# Patient Record
Sex: Female | Born: 1935 | Race: White | Hispanic: No
Health system: Southern US, Community
[De-identification: ages and names within clinical notes are randomized; demographics above are authoritative.]

---

## 2002-03-15 ENCOUNTER — Encounter: Payer: Self-pay | Admitting: General Surgery

## 2002-03-15 ENCOUNTER — Ambulatory Visit (HOSPITAL_COMMUNITY): Admission: RE | Admit: 2002-03-15 | Discharge: 2002-03-15 | Payer: Self-pay | Admitting: General Surgery

## 2002-10-19 ENCOUNTER — Encounter: Payer: Self-pay | Admitting: Emergency Medicine

## 2002-10-19 ENCOUNTER — Inpatient Hospital Stay (HOSPITAL_COMMUNITY): Admission: EM | Admit: 2002-10-19 | Discharge: 2002-10-24 | Payer: Self-pay | Admitting: Emergency Medicine

## 2003-03-16 ENCOUNTER — Emergency Department (HOSPITAL_COMMUNITY): Admission: EM | Admit: 2003-03-16 | Discharge: 2003-03-16 | Payer: Self-pay | Admitting: Internal Medicine

## 2003-04-26 ENCOUNTER — Encounter: Payer: Self-pay | Admitting: Orthopedic Surgery

## 2003-04-26 ENCOUNTER — Encounter: Admission: RE | Admit: 2003-04-26 | Discharge: 2003-04-26 | Payer: Self-pay | Admitting: Orthopedic Surgery

## 2003-04-26 ENCOUNTER — Encounter: Payer: Self-pay | Admitting: Radiology

## 2003-05-17 ENCOUNTER — Encounter: Admission: RE | Admit: 2003-05-17 | Discharge: 2003-05-17 | Payer: Self-pay | Admitting: Orthopedic Surgery

## 2003-05-17 ENCOUNTER — Encounter: Payer: Self-pay | Admitting: Orthopedic Surgery

## 2003-05-31 ENCOUNTER — Encounter: Payer: Self-pay | Admitting: Orthopedic Surgery

## 2003-05-31 ENCOUNTER — Encounter: Admission: RE | Admit: 2003-05-31 | Discharge: 2003-05-31 | Payer: Self-pay | Admitting: Orthopedic Surgery

## 2003-08-07 ENCOUNTER — Ambulatory Visit (HOSPITAL_COMMUNITY): Admission: RE | Admit: 2003-08-07 | Discharge: 2003-08-07 | Payer: Self-pay | Admitting: Family Medicine

## 2003-08-07 ENCOUNTER — Encounter: Payer: Self-pay | Admitting: Family Medicine

## 2003-09-27 ENCOUNTER — Ambulatory Visit (HOSPITAL_COMMUNITY): Admission: RE | Admit: 2003-09-27 | Discharge: 2003-09-27 | Payer: Self-pay | Admitting: Orthopedic Surgery

## 2003-10-12 ENCOUNTER — Encounter: Payer: Self-pay | Admitting: Emergency Medicine

## 2003-10-12 ENCOUNTER — Emergency Department (HOSPITAL_COMMUNITY): Admission: EM | Admit: 2003-10-12 | Discharge: 2003-10-12 | Payer: Self-pay | Admitting: Emergency Medicine

## 2003-11-08 ENCOUNTER — Ambulatory Visit (HOSPITAL_COMMUNITY): Admission: RE | Admit: 2003-11-08 | Discharge: 2003-11-08 | Payer: Self-pay | Admitting: Neurology

## 2004-07-23 ENCOUNTER — Inpatient Hospital Stay (HOSPITAL_COMMUNITY): Admission: EM | Admit: 2004-07-23 | Discharge: 2004-07-24 | Payer: Self-pay | Admitting: Emergency Medicine

## 2004-07-24 ENCOUNTER — Inpatient Hospital Stay (HOSPITAL_COMMUNITY): Admission: EM | Admit: 2004-07-24 | Discharge: 2004-07-27 | Payer: Self-pay | Admitting: Emergency Medicine

## 2004-07-26 ENCOUNTER — Encounter: Payer: Self-pay | Admitting: Neurology

## 2004-09-28 ENCOUNTER — Ambulatory Visit (HOSPITAL_COMMUNITY): Admission: RE | Admit: 2004-09-28 | Discharge: 2004-09-28 | Payer: Self-pay | Admitting: Family Medicine

## 2004-10-07 ENCOUNTER — Ambulatory Visit (HOSPITAL_COMMUNITY): Admission: RE | Admit: 2004-10-07 | Discharge: 2004-10-07 | Payer: Self-pay | Admitting: Family Medicine

## 2004-10-20 ENCOUNTER — Encounter (HOSPITAL_COMMUNITY): Admission: RE | Admit: 2004-10-20 | Discharge: 2004-11-19 | Payer: Self-pay | Admitting: Orthopedic Surgery

## 2004-10-22 ENCOUNTER — Emergency Department (HOSPITAL_COMMUNITY): Admission: EM | Admit: 2004-10-22 | Discharge: 2004-10-22 | Payer: Self-pay | Admitting: Emergency Medicine

## 2004-11-20 ENCOUNTER — Encounter (HOSPITAL_COMMUNITY): Admission: RE | Admit: 2004-11-20 | Discharge: 2004-12-20 | Payer: Self-pay | Admitting: Orthopedic Surgery

## 2004-12-04 ENCOUNTER — Emergency Department (HOSPITAL_COMMUNITY): Admission: EM | Admit: 2004-12-04 | Discharge: 2004-12-04 | Payer: Self-pay | Admitting: Emergency Medicine

## 2004-12-09 ENCOUNTER — Ambulatory Visit: Payer: Self-pay | Admitting: Orthopedic Surgery

## 2005-03-31 ENCOUNTER — Ambulatory Visit: Payer: Self-pay | Admitting: Cardiology

## 2005-03-31 ENCOUNTER — Ambulatory Visit (HOSPITAL_COMMUNITY): Admission: RE | Admit: 2005-03-31 | Discharge: 2005-03-31 | Payer: Self-pay | Admitting: Neurology

## 2005-06-04 ENCOUNTER — Ambulatory Visit (HOSPITAL_COMMUNITY): Admission: RE | Admit: 2005-06-04 | Discharge: 2005-06-04 | Payer: Self-pay | Admitting: Family Medicine

## 2005-06-15 ENCOUNTER — Emergency Department (HOSPITAL_COMMUNITY): Admission: EM | Admit: 2005-06-15 | Discharge: 2005-06-15 | Payer: Self-pay | Admitting: Emergency Medicine

## 2005-08-02 ENCOUNTER — Ambulatory Visit (HOSPITAL_COMMUNITY): Admission: RE | Admit: 2005-08-02 | Discharge: 2005-08-02 | Payer: Self-pay | Admitting: Family Medicine

## 2005-09-09 ENCOUNTER — Ambulatory Visit (HOSPITAL_COMMUNITY): Admission: RE | Admit: 2005-09-09 | Discharge: 2005-09-09 | Payer: Self-pay | Admitting: Neurology

## 2006-10-21 ENCOUNTER — Ambulatory Visit (HOSPITAL_COMMUNITY): Admission: RE | Admit: 2006-10-21 | Discharge: 2006-10-21 | Payer: Self-pay | Admitting: Family Medicine

## 2006-12-05 ENCOUNTER — Ambulatory Visit (HOSPITAL_COMMUNITY): Admission: RE | Admit: 2006-12-05 | Discharge: 2006-12-05 | Payer: Self-pay | Admitting: Urology

## 2007-01-13 ENCOUNTER — Ambulatory Visit (HOSPITAL_COMMUNITY): Admission: RE | Admit: 2007-01-13 | Discharge: 2007-01-13 | Payer: Self-pay | Admitting: Family Medicine

## 2007-01-19 ENCOUNTER — Ambulatory Visit (HOSPITAL_COMMUNITY): Admission: RE | Admit: 2007-01-19 | Discharge: 2007-01-19 | Payer: Self-pay | Admitting: Family Medicine

## 2007-06-14 ENCOUNTER — Ambulatory Visit (HOSPITAL_COMMUNITY): Admission: RE | Admit: 2007-06-14 | Discharge: 2007-06-14 | Payer: Self-pay | Admitting: Family Medicine

## 2007-06-15 ENCOUNTER — Ambulatory Visit (HOSPITAL_COMMUNITY): Admission: RE | Admit: 2007-06-15 | Discharge: 2007-06-15 | Payer: Self-pay | Admitting: Family Medicine

## 2007-07-03 ENCOUNTER — Ambulatory Visit: Payer: Self-pay | Admitting: Orthopedic Surgery

## 2007-07-24 ENCOUNTER — Ambulatory Visit: Payer: Self-pay | Admitting: Orthopedic Surgery

## 2007-07-25 DIAGNOSIS — R569 Unspecified convulsions: Secondary | ICD-10-CM | POA: Insufficient documentation

## 2007-07-25 DIAGNOSIS — Z8679 Personal history of other diseases of the circulatory system: Secondary | ICD-10-CM | POA: Insufficient documentation

## 2007-07-25 DIAGNOSIS — E119 Type 2 diabetes mellitus without complications: Secondary | ICD-10-CM | POA: Insufficient documentation

## 2007-12-18 ENCOUNTER — Ambulatory Visit (HOSPITAL_COMMUNITY): Admission: RE | Admit: 2007-12-18 | Discharge: 2007-12-18 | Payer: Self-pay | Admitting: Family Medicine

## 2007-12-20 ENCOUNTER — Ambulatory Visit (HOSPITAL_COMMUNITY): Admission: RE | Admit: 2007-12-20 | Discharge: 2007-12-20 | Payer: Self-pay | Admitting: Family Medicine

## 2007-12-27 ENCOUNTER — Emergency Department (HOSPITAL_COMMUNITY): Admission: EM | Admit: 2007-12-27 | Discharge: 2007-12-27 | Payer: Self-pay | Admitting: Emergency Medicine

## 2008-01-19 ENCOUNTER — Emergency Department (HOSPITAL_COMMUNITY): Admission: EM | Admit: 2008-01-19 | Discharge: 2008-01-19 | Payer: Self-pay | Admitting: Emergency Medicine

## 2008-01-19 ENCOUNTER — Encounter: Payer: Self-pay | Admitting: Orthopedic Surgery

## 2008-01-22 ENCOUNTER — Ambulatory Visit: Payer: Self-pay | Admitting: Orthopedic Surgery

## 2008-01-22 DIAGNOSIS — S62319A Displaced fracture of base of unspecified metacarpal bone, initial encounter for closed fracture: Secondary | ICD-10-CM

## 2008-03-04 ENCOUNTER — Ambulatory Visit: Payer: Self-pay | Admitting: Orthopedic Surgery

## 2008-09-02 ENCOUNTER — Ambulatory Visit: Payer: Self-pay | Admitting: Cardiology

## 2008-09-02 ENCOUNTER — Inpatient Hospital Stay (HOSPITAL_COMMUNITY): Admission: AD | Admit: 2008-09-02 | Discharge: 2008-09-04 | Payer: Self-pay

## 2008-09-03 ENCOUNTER — Encounter (INDEPENDENT_AMBULATORY_CARE_PROVIDER_SITE_OTHER): Payer: Self-pay | Admitting: Internal Medicine

## 2009-03-13 ENCOUNTER — Ambulatory Visit (HOSPITAL_COMMUNITY): Admission: RE | Admit: 2009-03-13 | Discharge: 2009-03-13 | Payer: Self-pay | Admitting: Family Medicine

## 2009-08-11 ENCOUNTER — Other Ambulatory Visit: Admission: RE | Admit: 2009-08-11 | Discharge: 2009-08-11 | Payer: Self-pay | Admitting: Family Medicine

## 2009-08-11 ENCOUNTER — Encounter (INDEPENDENT_AMBULATORY_CARE_PROVIDER_SITE_OTHER): Payer: Self-pay | Admitting: Family Medicine

## 2010-07-10 ENCOUNTER — Emergency Department (HOSPITAL_COMMUNITY)
Admission: EM | Admit: 2010-07-10 | Discharge: 2010-07-10 | Payer: Self-pay | Source: Home / Self Care | Admitting: Emergency Medicine

## 2010-08-22 ENCOUNTER — Ambulatory Visit: Payer: Self-pay | Admitting: Cardiology

## 2010-08-22 ENCOUNTER — Inpatient Hospital Stay (HOSPITAL_COMMUNITY): Admission: EM | Admit: 2010-08-22 | Discharge: 2010-08-26 | Payer: Self-pay | Admitting: Emergency Medicine

## 2010-08-25 ENCOUNTER — Encounter (INDEPENDENT_AMBULATORY_CARE_PROVIDER_SITE_OTHER): Payer: Self-pay | Admitting: Internal Medicine

## 2010-09-26 ENCOUNTER — Emergency Department (HOSPITAL_COMMUNITY)
Admission: EM | Admit: 2010-09-26 | Discharge: 2010-09-27 | Payer: Self-pay | Source: Home / Self Care | Admitting: Emergency Medicine

## 2010-10-03 ENCOUNTER — Ambulatory Visit: Payer: Self-pay | Admitting: Cardiology

## 2010-10-03 ENCOUNTER — Inpatient Hospital Stay (HOSPITAL_COMMUNITY): Admission: EM | Admit: 2010-10-03 | Discharge: 2010-10-07 | Payer: Self-pay | Admitting: Emergency Medicine

## 2010-10-07 ENCOUNTER — Encounter (INDEPENDENT_AMBULATORY_CARE_PROVIDER_SITE_OTHER): Payer: Self-pay | Admitting: *Deleted

## 2010-10-07 LAB — CONVERTED CEMR LAB
BUN: 31 mg/dL
GFR calc non Af Amer: 34 mL/min
Glucose, Bld: 89 mg/dL
Sodium: 140 meq/L

## 2010-10-26 ENCOUNTER — Encounter (INDEPENDENT_AMBULATORY_CARE_PROVIDER_SITE_OTHER): Payer: Self-pay | Admitting: *Deleted

## 2010-10-30 ENCOUNTER — Inpatient Hospital Stay (HOSPITAL_COMMUNITY): Admission: EM | Admit: 2010-10-30 | Discharge: 2010-11-03 | Payer: Self-pay | Admitting: Emergency Medicine

## 2010-10-30 ENCOUNTER — Encounter (INDEPENDENT_AMBULATORY_CARE_PROVIDER_SITE_OTHER): Payer: Self-pay

## 2010-10-30 LAB — CONVERTED CEMR LAB
Calcium: 8.1 mg/dL
Chloride: 97 meq/L
Creatinine, Ser: 1.35 mg/dL
Potassium: 4 meq/L
Sodium: 128 meq/L

## 2010-11-03 ENCOUNTER — Inpatient Hospital Stay: Admission: AD | Admit: 2010-11-03 | Discharge: 2010-11-20 | Payer: Self-pay | Admitting: Internal Medicine

## 2010-11-09 ENCOUNTER — Encounter (INDEPENDENT_AMBULATORY_CARE_PROVIDER_SITE_OTHER): Payer: Self-pay

## 2010-11-10 ENCOUNTER — Ambulatory Visit: Payer: Self-pay | Admitting: Cardiology

## 2010-11-10 DIAGNOSIS — N039 Chronic nephritic syndrome with unspecified morphologic changes: Secondary | ICD-10-CM

## 2010-11-10 DIAGNOSIS — L89009 Pressure ulcer of unspecified elbow, unspecified stage: Secondary | ICD-10-CM | POA: Insufficient documentation

## 2010-11-10 DIAGNOSIS — D631 Anemia in chronic kidney disease: Secondary | ICD-10-CM | POA: Insufficient documentation

## 2010-11-10 DIAGNOSIS — I5023 Acute on chronic systolic (congestive) heart failure: Secondary | ICD-10-CM

## 2010-11-11 ENCOUNTER — Encounter: Payer: Self-pay | Admitting: Adult Health

## 2010-11-18 ENCOUNTER — Encounter: Payer: Self-pay | Admitting: Cardiology

## 2010-11-20 ENCOUNTER — Emergency Department (HOSPITAL_COMMUNITY)
Admission: EM | Admit: 2010-11-20 | Discharge: 2010-11-20 | Payer: Self-pay | Source: Home / Self Care | Admitting: Emergency Medicine

## 2010-11-20 ENCOUNTER — Inpatient Hospital Stay (HOSPITAL_COMMUNITY): Admission: EM | Admit: 2010-11-20 | Discharge: 2010-11-21 | Payer: Self-pay | Admitting: Emergency Medicine

## 2010-12-10 ENCOUNTER — Ambulatory Visit: Payer: Self-pay | Admitting: Adult Health

## 2010-12-20 DEATH — deceased

## 2011-01-10 ENCOUNTER — Encounter: Payer: Self-pay | Admitting: Emergency Medicine

## 2011-01-21 NOTE — Assessment & Plan Note (Signed)
Summary: post hosp [name] per Tammy on  300/tg   Visit Type:  Follow-up Primary Provider:  Dr.Robsan   History of Present Illness: [name] is a 77 y/o CF we are following for ongoing assessment and treatment of chronic systolic heart failure, CVA, and hypertension, with known history of CKD (baseline 1.5-1.6), Alzheimer's dementia and anemia.Marland Kitchen  She was recently hospitalized at [facility] for acute exacerbation of CHF, was diuresed appropriately and found to have a decreased EF of 35% which was significantly changed from prior ECHO with EF of 55%.  She was placed on metolozone, increased dose of lasix of 80mg  daily, and coreg was increased to 25mg  two times a day.  She is currently at the [facility] where she is undergoing PT for deconditioning.  She is unable to discuss any symptoms as she is of poor memory with dementia.  Her daughters are with her.  Current Medications (verified): 1)  Ferrous Sulfate 325 (65 Fe) Mg Tabs (Ferrous Sulfate) .... Take 1 Tab Daily 2)  Metolazone 5 Mg Tabs (Metolazone) .... As Needed For Wt Gain 3)  Alprazolam 1 Mg Xr24h-Tab (Alprazolam) .... Take 1 Tab Three Times A Day 4)  Coreg 25 Mg Tabs (Carvedilol) .... Take 1 Tab Two Times A Day 5)  Lexapro 10 Mg Tabs (Escitalopram Oxalate) .... Take 1 Tab Daily 6)  Namenda 10 Mg Tabs (Memantine Hcl) .... Take 1 Tab Daily 7)  Losartan Potassium 50 Mg Tabs (Losartan Potassium) .... Take 1 Tab Daily 8)  Phenytoin Sodium Extended 200 Mg Caps (Phenytoin Sodium Extended) .... Take 1 Tab Two Times A Day 9)  Potassium Chloride 20 Meq/178ml Soln (Potassium Chloride) .... Take 1 Tab Daily 10)  Ambien 5 Mg Tabs (Zolpidem Tartrate) .... Take 1 Tablet By Mouth At Bedtime As Needed For Sleep 11)  Lasix 80 Mg Tabs (Furosemide) .... Take 1 Tab Daily 12)  Norco 5-325 Mg Tabs (Hydrocodone-Acetaminophen) .... As Needed For Pain 13)  Ativan 1 Mg Tabs (Lorazepam) .... As Needed 14)  Tylenol Extra Strength 500 Mg Tabs (Acetaminophen) ....  Q 6hrs As Needed 15)  Colace 100 Mg Caps (Docusate Sodium) .... Take 1 Tablet By Mouth At Bedtime  Allergies (verified): 1)  ! Pcn 2)  ! Asa  Review of Systems       All other systems have been reviewed and are negative unless stated above.   Vital Signs:  Patient profile:   77 year old female Weight:      114 pounds O2 Sat:      99 % on Room air Pulse rate:   84 / minute BP sitting:   114 / 61  (right arm)  Vitals Entered By: Dreama Saa, CNA (November 10, 2010 1:45 PM)  O2 Flow:  Room air  Physical Exam  General:  normal appearance.   Lungs:  Clear bilaterally to auscultation and percussion. Heart:  RRR without MRG, occaisional extra systole. Abdomen:  Bowel sounds positive; abdomen soft and non-tender without masses, organomegaly, or hernias noted. No hepatosplenomegaly. Msk:  She is deconditioned and is in a wheelchair Pulses:  pulses normal in all 4 extremities Extremities:  No clubbing or cyanosis. Neurologic:  Some HOH. Psych:  poor memory.     Impression & Recommendations:  Problem # 1:  ACUTE ON CHRONIC SYSTOLIC HEART FAILURE (ICD-428.23) She appers euvolemic at present.  Her wt is stable.  I have spent greater than 20 minutes discussing test results and explaining her diagnosis to her daughters, to  include medications and management of heart failure.  I have sent orders to [facility] to weigh her daily, report wt gain of 2-3 lbs in 1-2 days, 5 lbs in one week.  Low sodium diet.  Daughters are planning to admit her to [facility] once rehab is over at the [facility]. Follow up BMET and CBC will be drawn. The following medications were removed from the medication list:    Plavix 75 Mg Tabs (Clopidogrel bisulfate) .Marland Kitchen... 1qd Her updated medication list for this problem includes:    Metolazone 5 Mg Tabs (Metolazone) .Marland Kitchen... As needed for wt gain    Coreg 25 Mg Tabs (Carvedilol) .Marland Kitchen... Take 1 tab two times a day    Losartan Potassium 50 Mg Tabs (Losartan potassium)  .Marland Kitchen... Take 1 tab daily    Lasix 80 Mg Tabs (Furosemide) .Marland Kitchen... Take 1 tab daily  Problem # 2:  ANEMIA IN CHRONIC KIDNEY DISEASE (ICD-285.21) She is on iron replacement and complaining of constipation.  I have ordered stool softner to use as needed.  CBC is pending.  Problem # 3:  HIGH BLOOD PRESSURE (ICD-V12.50) Will need to keep BP low normal in the range of 95-110 systolic. Orders: T-Basic Metabolic Panel [id number]) T-CBC w/Diff [phone])  Other Orders: Durable Medical Equipment (DME)  Patient Instructions: 1)  Your physician recommends that you schedule a follow-up appointment in: 1 month 2)  Your physician has recommended you make the following change in your medication: Start taking Colace 100mg  by mouth at bedtime and decrease Ambien to 5mg  as needed for sleep 3)  Your physician recommends that you weigh, daily, at the same time every day, and in the same amount of clothing.  Please record your daily weights on the handout provided and bring it to your next appointment. 4)  Your physician recommends that you return for lab work in: Today 5)  You have been advised to use elbow protectors to protect elbows while resting them. 6)  ***Please see Consultation request and report sheet given to pt. at todays visit. Prescriptions: AMBIEN 5 MG TABS (ZOLPIDEM TARTRATE) take 1 tablet by mouth at bedtime as needed for sleep  #30 x 3   Entered by:   Larita Fife Via LPN   Authorized by:   Joni Reining, NP   Signed by:   Larita Fife Via LPN on 30/86/5784   Method used:   Print then Give to Patient   RxID:   [id number] COLACE 100 MG CAPS (DOCUSATE SODIUM) take 1 tablet by mouth at bedtime  #30 x 3   Entered by:   Larita Fife Via LPN   Authorized by:   Joni Reining, NP   Signed by:   Larita Fife Via LPN on 02/72/5366   Method used:   Print then Give to Patient   RxID:   [id number]

## 2011-01-21 NOTE — Miscellaneous (Signed)
Summary: labs cmp,10/07/2010  Clinical Lists Changes  Observations: Added new observation of CALCIUM: 8.3 mg/dL (16/09/9603 54:09) Added new observation of GFR AA: 41 mL/min/1.73m2 (10/07/2010 11:35) Added new observation of GFR: 34 mL/min (10/07/2010 11:35) Added new observation of CREATININE: 1.46 mg/dL (81/19/1478 29:56) Added new observation of BUN: 31 mg/dL (21/30/8657 84:69) Added new observation of BG RANDOM: 89 mg/dL (62/95/2841 32:44) Added new observation of CO2 PLSM/SER: 30 meq/L (10/07/2010 11:35) Added new observation of CL SERUM: 102 meq/L (10/07/2010 11:35) Added new observation of K SERUM: 3.5 meq/L (10/07/2010 11:35) Added new observation of NA: 140 meq/L (10/07/2010 11:35)

## 2011-01-21 NOTE — Miscellaneous (Signed)
Summary: Orders Update  Clinical Lists Changes  Orders: Added new Test order of T-Basic Metabolic Panel ([id number]) - Signed  

## 2011-01-21 NOTE — Miscellaneous (Signed)
Summary: BMET  Clinical Lists Changes  Observations: Added new observation of CALCIUM: 8.1 mg/dL (19/14/7829 56:21) Added new observation of CREATININE: 1.35 mg/dL (30/86/5784 69:62) Added new observation of BUN: 29 mg/dL (95/28/4132 44:01) Added new observation of BG RANDOM: 129 mg/dL (02/72/5366 44:03) Added new observation of CO2 PLSM/SER: 23 meq/L (10/30/2010 10:57) Added new observation of CL SERUM: 97 meq/L (10/30/2010 10:57) Added new observation of K SERUM: 4.0 meq/L (10/30/2010 10:57) Added new observation of NA: 128 meq/L (10/30/2010 10:57)

## 2011-01-21 NOTE — Letter (Signed)
Summary: LABS  LABS   Imported By: Faythe Ghee 11/18/2010 13:30:59  _____________________________________________________________________  External Attachment:    Type:   Image     Comment:   External Document  Appended Document: LABS Patient seen recently by Ms. Lawrence, will forward.

## 2011-01-21 NOTE — Letter (Signed)
Summary: [facility] [employer]  [address] [place] [address]   Phone: [phone]  Fax: [phone]     October 07, 2010 MRN: [id number]   [facility] [address] [place], [place]  [address]      YOUR LAB WORK IS DUE   October 30, 2010  Please go to [facility], located across the street from [facility] on the second floor.  Hours are Monday - Friday 7am until 7:30pm         Saturday 8am until 12noon      YOUR LABWORK IS NOT FASTING --YOU MAY EAT PRIOR TO LABWORK

## 2011-03-01 LAB — BASIC METABOLIC PANEL
BUN: 44 mg/dL — ABNORMAL HIGH (ref 6–23)
CO2: 22 mEq/L (ref 19–32)
CO2: 25 mEq/L (ref 19–32)
Chloride: 102 mEq/L (ref 96–112)
Chloride: 104 mEq/L (ref 96–112)
Creatinine, Ser: 1.35 mg/dL — ABNORMAL HIGH (ref 0.4–1.2)
GFR calc Af Amer: 37 mL/min — ABNORMAL LOW (ref >60)
GFR calc Af Amer: 45 mL/min — ABNORMAL LOW (ref >60)
GFR calc non Af Amer: 37 mL/min — ABNORMAL LOW (ref >60)
Glucose, Bld: 150 mg/dL — ABNORMAL HIGH (ref 70–99)
Potassium: 4.8 mEq/L (ref 3.5–5.1)
Potassium: 5.5 mEq/L — ABNORMAL HIGH (ref 3.5–5.1)
Sodium: 135 mEq/L (ref 135–145)

## 2011-03-01 LAB — DIFFERENTIAL
Basophils Absolute: 0 10*3/uL (ref 0.0–0.1)
Eosinophils Absolute: 0 10*3/uL (ref 0.0–0.7)
Eosinophils Absolute: 0.1 10*3/uL (ref 0.0–0.7)
Eosinophils Relative: 0 % (ref 0–5)
Eosinophils Relative: 2 % (ref 0–5)
Lymphocytes Relative: 19 % (ref 12–46)
Lymphocytes Relative: 23 % (ref 12–46)
Lymphocytes Relative: 23 % (ref 12–46)
Lymphs Abs: 1.2 10*3/uL (ref 0.7–4.0)
Lymphs Abs: 1.5 10*3/uL (ref 0.7–4.0)
Monocytes Absolute: 0.6 10*3/uL (ref 0.1–1.0)
Monocytes Absolute: 0.8 10*3/uL (ref 0.1–1.0)
Monocytes Relative: 8 % (ref 3–12)
Neutro Abs: 4 10*3/uL (ref 1.7–7.7)

## 2011-03-01 LAB — CBC
HCT: 31.2 % — ABNORMAL LOW (ref 36.0–46.0)
HCT: 33.4 % — ABNORMAL LOW (ref 36.0–46.0)
Hemoglobin: 10.1 g/dL — ABNORMAL LOW (ref 12.0–15.0)
MCH: 26.4 pg (ref 26.0–34.0)
MCH: 26.6 pg (ref 26.0–34.0)
MCH: 26.6 pg (ref 26.0–34.0)
MCHC: 31.4 g/dL (ref 30.0–36.0)
MCV: 82.1 fL (ref 78.0–100.0)
MCV: 82.3 fL (ref 78.0–100.0)
MCV: 84.1 fL (ref 78.0–100.0)
Platelets: 118 10*3/uL — ABNORMAL LOW (ref 150–400)
Platelets: 136 10*3/uL — ABNORMAL LOW (ref 150–400)
RBC: 3.64 MIL/uL — ABNORMAL LOW (ref 3.87–5.11)
RBC: 3.8 MIL/uL — ABNORMAL LOW (ref 3.87–5.11)
RBC: 4.06 MIL/uL (ref 3.87–5.11)
WBC: 6.4 10*3/uL (ref 4.0–10.5)
WBC: 6.6 10*3/uL (ref 4.0–10.5)

## 2011-03-01 LAB — COMPREHENSIVE METABOLIC PANEL
Albumin: 3 g/dL — ABNORMAL LOW (ref 3.5–5.2)
BUN: 48 mg/dL — ABNORMAL HIGH (ref 6–23)
Chloride: 106 mEq/L (ref 96–112)
Creatinine, Ser: 1.95 mg/dL — ABNORMAL HIGH (ref 0.4–1.2)
GFR calc non Af Amer: 24 mL/min — ABNORMAL LOW (ref >60)
Total Bilirubin: 0.2 mg/dL — ABNORMAL LOW (ref 0.3–1.2)

## 2011-03-01 LAB — LIPID PANEL
Cholesterol: 166 mg/dL (ref 0–200)
HDL: 48 mg/dL (ref >39)
Total CHOL/HDL Ratio: 3.5 RATIO
VLDL: 29 mg/dL (ref 0–40)

## 2011-03-01 LAB — CARDIAC PANEL(CRET KIN+CKTOT+MB+TROPI)
Relative Index: INVALID (ref 0.0–2.5)
Relative Index: INVALID (ref 0.0–2.5)
Troponin I: 0.09 ng/mL — ABNORMAL HIGH (ref 0.00–0.06)
Troponin I: 0.1 ng/mL — ABNORMAL HIGH (ref 0.00–0.06)

## 2011-03-01 LAB — URINALYSIS, ROUTINE W REFLEX MICROSCOPIC
Glucose, UA: NEGATIVE mg/dL
Hgb urine dipstick: NEGATIVE
Ketones, ur: NEGATIVE mg/dL
pH: 7 (ref 5.0–8.0)

## 2011-03-01 LAB — POCT CARDIAC MARKERS
CKMB, poc: <1 ng/mL — ABNORMAL LOW (ref 1.0–8.0)
Myoglobin, poc: 73.2 ng/mL (ref 12–200)
Troponin i, poc: <0.05 ng/mL (ref 0.00–0.09)

## 2011-03-01 LAB — PHENYTOIN LEVEL, TOTAL: Phenytoin Lvl: 11 ug/mL (ref 10.0–20.0)

## 2011-03-02 LAB — CBC
HCT: 27.9 % — ABNORMAL LOW (ref 36.0–46.0)
HCT: 29.5 % — ABNORMAL LOW (ref 36.0–46.0)
Hemoglobin: 9 g/dL — ABNORMAL LOW (ref 12.0–15.0)
Hemoglobin: 9.4 g/dL — ABNORMAL LOW (ref 12.0–15.0)
MCH: 25.4 pg — ABNORMAL LOW (ref 26.0–34.0)
MCHC: 32 g/dL (ref 30.0–36.0)
MCHC: 32.5 g/dL (ref 30.0–36.0)
MCV: 78.3 fL (ref 78.0–100.0)
Platelets: 184 10*3/uL (ref 150–400)
RDW: 17.4 % — ABNORMAL HIGH (ref 11.5–15.5)
RDW: 18.2 % — ABNORMAL HIGH (ref 11.5–15.5)
WBC: 4.8 10*3/uL (ref 4.0–10.5)

## 2011-03-02 LAB — BASIC METABOLIC PANEL
BUN: 33 mg/dL — ABNORMAL HIGH (ref 6–23)
CO2: 24 mEq/L (ref 19–32)
CO2: 29 mEq/L (ref 19–32)
Calcium: 8.3 mg/dL — ABNORMAL LOW (ref 8.4–10.5)
Chloride: 97 mEq/L (ref 96–112)
Chloride: 98 mEq/L (ref 96–112)
Creatinine, Ser: 1.58 mg/dL — ABNORMAL HIGH (ref 0.4–1.2)
Creatinine, Ser: 1.65 mg/dL — ABNORMAL HIGH (ref 0.4–1.2)
GFR calc Af Amer: 36 mL/min — ABNORMAL LOW (ref >60)
GFR calc Af Amer: 38 mL/min — ABNORMAL LOW (ref >60)
GFR calc non Af Amer: 30 mL/min — ABNORMAL LOW (ref >60)
GFR calc non Af Amer: 31 mL/min — ABNORMAL LOW (ref >60)
Glucose, Bld: 106 mg/dL — ABNORMAL HIGH (ref 70–99)
Potassium: 4.3 mEq/L (ref 3.5–5.1)
Sodium: 135 mEq/L (ref 135–145)

## 2011-03-02 LAB — DIFFERENTIAL
Basophils Absolute: 0 10*3/uL (ref 0.0–0.1)
Basophils Absolute: 0 10*3/uL (ref 0.0–0.1)
Basophils Relative: 0 % (ref 0–1)
Basophils Relative: 1 % (ref 0–1)
Eosinophils Absolute: 0.1 10*3/uL (ref 0.0–0.7)
Eosinophils Relative: 3 % (ref 0–5)
Lymphocytes Relative: 23 % (ref 12–46)
Lymphs Abs: 1.7 10*3/uL (ref 0.7–4.0)
Monocytes Absolute: 0.6 10*3/uL (ref 0.1–1.0)
Monocytes Relative: 10 % (ref 3–12)
Neutrophils Relative %: 65 % (ref 43–77)

## 2011-03-02 LAB — URINALYSIS, ROUTINE W REFLEX MICROSCOPIC
Glucose, UA: NEGATIVE mg/dL
Ketones, ur: NEGATIVE mg/dL
Protein, ur: NEGATIVE mg/dL

## 2011-03-02 LAB — COMPREHENSIVE METABOLIC PANEL
ALT: 62 U/L — ABNORMAL HIGH (ref 0–35)
ALT: 63 U/L — ABNORMAL HIGH (ref 0–35)
Albumin: 3.2 g/dL — ABNORMAL LOW (ref 3.5–5.2)
Alkaline Phosphatase: 165 U/L — ABNORMAL HIGH (ref 39–117)
CO2: 24 mEq/L (ref 19–32)
Calcium: 8.6 mg/dL (ref 8.4–10.5)
GFR calc Af Amer: 41 mL/min — ABNORMAL LOW (ref >60)
GFR calc non Af Amer: 30 mL/min — ABNORMAL LOW (ref >60)
Glucose, Bld: 131 mg/dL — ABNORMAL HIGH (ref 70–99)
Potassium: 3.8 mEq/L (ref 3.5–5.1)
Sodium: 130 mEq/L — ABNORMAL LOW (ref 135–145)
Sodium: 134 mEq/L — ABNORMAL LOW (ref 135–145)
Total Protein: 6.3 g/dL (ref 6.0–8.3)

## 2011-03-02 LAB — PHENYTOIN LEVEL, TOTAL: Phenytoin Lvl: 14.1 ug/mL (ref 10.0–20.0)

## 2011-03-02 LAB — BRAIN NATRIURETIC PEPTIDE
Pro B Natriuretic peptide (BNP): 1210 pg/mL — ABNORMAL HIGH (ref 0.0–100.0)
Pro B Natriuretic peptide (BNP): 1510 pg/mL — ABNORMAL HIGH (ref 0.0–100.0)
Pro B Natriuretic peptide (BNP): 1760 pg/mL — ABNORMAL HIGH (ref 0.0–100.0)

## 2011-03-02 LAB — URINE MICROSCOPIC-ADD ON

## 2011-03-02 LAB — PHENYTOIN LEVEL, FREE AND TOTAL: Phenytoin, Free: 2.89 mg/L — ABNORMAL HIGH (ref 1.00–2.00)

## 2011-03-03 LAB — BASIC METABOLIC PANEL
BUN: 31 mg/dL — ABNORMAL HIGH (ref 6–23)
BUN: 34 mg/dL — ABNORMAL HIGH (ref 6–23)
BUN: 36 mg/dL — ABNORMAL HIGH (ref 6–23)
BUN: 38 mg/dL — ABNORMAL HIGH (ref 6–23)
CO2: 23 mEq/L (ref 19–32)
CO2: 28 mEq/L (ref 19–32)
CO2: 30 mEq/L (ref 19–32)
Calcium: 8.1 mg/dL — ABNORMAL LOW (ref 8.4–10.5)
Calcium: 8.2 mg/dL — ABNORMAL LOW (ref 8.4–10.5)
Calcium: 8.2 mg/dL — ABNORMAL LOW (ref 8.4–10.5)
Calcium: 8.3 mg/dL — ABNORMAL LOW (ref 8.4–10.5)
Calcium: 8.4 mg/dL (ref 8.4–10.5)
Chloride: 103 mEq/L (ref 96–112)
Chloride: 104 mEq/L (ref 96–112)
Creatinine, Ser: 1.28 mg/dL — ABNORMAL HIGH (ref 0.4–1.2)
Creatinine, Ser: 1.3 mg/dL — ABNORMAL HIGH (ref 0.4–1.2)
Creatinine, Ser: 1.39 mg/dL — ABNORMAL HIGH (ref 0.4–1.2)
Creatinine, Ser: 1.39 mg/dL — ABNORMAL HIGH (ref 0.4–1.2)
GFR calc Af Amer: 44 mL/min — ABNORMAL LOW (ref >60)
GFR calc Af Amer: 47 mL/min — ABNORMAL LOW (ref >60)
GFR calc non Af Amer: 34 mL/min — ABNORMAL LOW (ref >60)
GFR calc non Af Amer: 36 mL/min — ABNORMAL LOW (ref >60)
GFR calc non Af Amer: 36 mL/min — ABNORMAL LOW (ref >60)
GFR calc non Af Amer: 39 mL/min — ABNORMAL LOW (ref >60)
Glucose, Bld: 89 mg/dL (ref 70–99)
Glucose, Bld: 95 mg/dL (ref 70–99)
Glucose, Bld: 99 mg/dL (ref 70–99)
Potassium: 3.9 mEq/L (ref 3.5–5.1)
Sodium: 133 mEq/L — ABNORMAL LOW (ref 135–145)

## 2011-03-03 LAB — URINALYSIS, ROUTINE W REFLEX MICROSCOPIC
Bilirubin Urine: NEGATIVE
Glucose, UA: NEGATIVE mg/dL
Specific Gravity, Urine: 1.02 (ref 1.005–1.030)
Urobilinogen, UA: 0.2 mg/dL (ref 0.0–1.0)

## 2011-03-03 LAB — DIFFERENTIAL
Basophils Relative: 0 % (ref 0–1)
Eosinophils Relative: 1 % (ref 0–5)
Monocytes Absolute: 0.7 10*3/uL (ref 0.1–1.0)
Monocytes Relative: 10 % (ref 3–12)
Neutro Abs: 4.6 10*3/uL (ref 1.7–7.7)

## 2011-03-03 LAB — CARDIAC PANEL(CRET KIN+CKTOT+MB+TROPI)
CK, MB: 3.3 ng/mL (ref 0.3–4.0)
CK, MB: 3.6 ng/mL (ref 0.3–4.0)
Relative Index: INVALID (ref 0.0–2.5)
Total CK: 58 U/L (ref 7–177)
Troponin I: 0.02 ng/mL (ref 0.00–0.06)
Troponin I: 0.03 ng/mL (ref 0.00–0.06)
Troponin I: 0.04 ng/mL (ref 0.00–0.06)

## 2011-03-03 LAB — CK TOTAL AND CKMB (NOT AT ARMC)
CK, MB: 3.6 ng/mL (ref 0.3–4.0)
Relative Index: INVALID (ref 0.0–2.5)
Total CK: 67 U/L (ref 7–177)

## 2011-03-03 LAB — CBC
HCT: 30.7 % — ABNORMAL LOW (ref 36.0–46.0)
Hemoglobin: 10 g/dL — ABNORMAL LOW (ref 12.0–15.0)
Hemoglobin: 10.1 g/dL — ABNORMAL LOW (ref 12.0–15.0)
MCH: 25.9 pg — ABNORMAL LOW (ref 26.0–34.0)
MCHC: 32.7 g/dL (ref 30.0–36.0)
MCV: 80.4 fL (ref 78.0–100.0)
Platelets: 250 10*3/uL (ref 150–400)
RBC: 3.9 MIL/uL (ref 3.87–5.11)
RDW: 15.5 % (ref 11.5–15.5)
WBC: 7 10*3/uL (ref 4.0–10.5)

## 2011-03-03 LAB — BRAIN NATRIURETIC PEPTIDE: Pro B Natriuretic peptide (BNP): 1880 pg/mL — ABNORMAL HIGH (ref 0.0–100.0)

## 2011-03-03 LAB — URINE MICROSCOPIC-ADD ON

## 2011-03-03 LAB — TROPONIN I: Troponin I: 0.05 ng/mL (ref 0.00–0.06)

## 2011-03-03 LAB — IRON AND TIBC
Iron: 25 ug/dL — ABNORMAL LOW (ref 42–135)
TIBC: 407 ug/dL (ref 250–470)

## 2011-03-03 LAB — MRSA PCR SCREENING: MRSA by PCR: POSITIVE — AB

## 2011-03-04 LAB — CBC
HCT: 29.7 % — ABNORMAL LOW (ref 36.0–46.0)
Hemoglobin: 10.1 g/dL — ABNORMAL LOW (ref 12.0–15.0)
MCH: 29 pg (ref 26.0–34.0)
MCH: 29.5 pg (ref 26.0–34.0)
MCH: 29.5 pg (ref 26.0–34.0)
MCHC: 33.4 g/dL (ref 30.0–36.0)
MCHC: 33.9 g/dL (ref 30.0–36.0)
MCV: 86.7 fL (ref 78.0–100.0)
MCV: 87.1 fL (ref 78.0–100.0)
MCV: 88.2 fL (ref 78.0–100.0)
Platelets: 193 10*3/uL (ref 150–400)
Platelets: 194 10*3/uL (ref 150–400)
RBC: 3.31 MIL/uL — ABNORMAL LOW (ref 3.87–5.11)
RDW: 13.4 % (ref 11.5–15.5)
RDW: 13.7 % (ref 11.5–15.5)

## 2011-03-04 LAB — COMPREHENSIVE METABOLIC PANEL
ALT: 68 U/L — ABNORMAL HIGH (ref 0–35)
ALT: 77 U/L — ABNORMAL HIGH (ref 0–35)
AST: 61 U/L — ABNORMAL HIGH (ref 0–37)
BUN: 31 mg/dL — ABNORMAL HIGH (ref 6–23)
CO2: 24 mEq/L (ref 19–32)
CO2: 27 mEq/L (ref 19–32)
Calcium: 8.5 mg/dL (ref 8.4–10.5)
Calcium: 8.7 mg/dL (ref 8.4–10.5)
Chloride: 107 mEq/L (ref 96–112)
Creatinine, Ser: 1.3 mg/dL — ABNORMAL HIGH (ref 0.4–1.2)
Creatinine, Ser: 1.34 mg/dL — ABNORMAL HIGH (ref 0.4–1.2)
GFR calc Af Amer: 47 mL/min — ABNORMAL LOW (ref >60)
GFR calc non Af Amer: 38 mL/min — ABNORMAL LOW (ref >60)
GFR calc non Af Amer: 39 mL/min — ABNORMAL LOW (ref >60)
Glucose, Bld: 103 mg/dL — ABNORMAL HIGH (ref 70–99)
Glucose, Bld: 98 mg/dL (ref 70–99)
Sodium: 138 mEq/L (ref 135–145)
Sodium: 140 mEq/L (ref 135–145)
Total Bilirubin: 0.5 mg/dL (ref 0.3–1.2)

## 2011-03-04 LAB — BASIC METABOLIC PANEL
BUN: 42 mg/dL — ABNORMAL HIGH (ref 6–23)
BUN: 49 mg/dL — ABNORMAL HIGH (ref 6–23)
CO2: 24 mEq/L (ref 19–32)
CO2: 25 mEq/L (ref 19–32)
CO2: 28 mEq/L (ref 19–32)
Chloride: 103 mEq/L (ref 96–112)
Chloride: 103 mEq/L (ref 96–112)
Chloride: 105 mEq/L (ref 96–112)
Creatinine, Ser: 1.64 mg/dL — ABNORMAL HIGH (ref 0.4–1.2)
GFR calc Af Amer: 38 mL/min — ABNORMAL LOW (ref >60)
GFR calc Af Amer: 38 mL/min — ABNORMAL LOW (ref >60)
Potassium: 3.6 mEq/L (ref 3.5–5.1)
Potassium: 3.8 mEq/L (ref 3.5–5.1)

## 2011-03-04 LAB — DIFFERENTIAL
Basophils Absolute: 0 10*3/uL (ref 0.0–0.1)
Eosinophils Absolute: 0.1 10*3/uL (ref 0.0–0.7)
Eosinophils Absolute: 0.1 10*3/uL (ref 0.0–0.7)
Eosinophils Absolute: 0.2 10*3/uL (ref 0.0–0.7)
Eosinophils Relative: 2 % (ref 0–5)
Eosinophils Relative: 4 % (ref 0–5)
Lymphs Abs: 1 10*3/uL (ref 0.7–4.0)
Neutro Abs: 3.3 10*3/uL (ref 1.7–7.7)
Neutrophils Relative %: 68 % (ref 43–77)
Neutrophils Relative %: 74 % (ref 43–77)

## 2011-03-04 LAB — TSH: TSH: 1.767 u[IU]/mL (ref 0.350–4.500)

## 2011-03-04 LAB — TROPONIN I: Troponin I: 0.2 ng/mL — ABNORMAL HIGH (ref 0.00–0.06)

## 2011-03-04 LAB — CARDIAC PANEL(CRET KIN+CKTOT+MB+TROPI)
CK, MB: 3.8 ng/mL (ref 0.3–4.0)
Relative Index: 3.5 — ABNORMAL HIGH (ref 0.0–2.5)
Relative Index: INVALID (ref 0.0–2.5)
Troponin I: 0.1 ng/mL — ABNORMAL HIGH (ref 0.00–0.06)
Troponin I: 0.11 ng/mL — ABNORMAL HIGH (ref 0.00–0.06)
Troponin I: 0.12 ng/mL — ABNORMAL HIGH (ref 0.00–0.06)

## 2011-03-04 LAB — CK TOTAL AND CKMB (NOT AT ARMC): Relative Index: INVALID (ref 0.0–2.5)

## 2011-03-04 LAB — BRAIN NATRIURETIC PEPTIDE
Pro B Natriuretic peptide (BNP): 1160 pg/mL — ABNORMAL HIGH (ref 0.0–100.0)
Pro B Natriuretic peptide (BNP): 1210 pg/mL — ABNORMAL HIGH (ref 0.0–100.0)
Pro B Natriuretic peptide (BNP): 1370 pg/mL — ABNORMAL HIGH (ref 0.0–100.0)
Pro B Natriuretic peptide (BNP): 966 pg/mL — ABNORMAL HIGH (ref 0.0–100.0)

## 2011-03-04 LAB — LIPID PANEL
Cholesterol: 180 mg/dL (ref 0–200)
Triglycerides: 133 mg/dL (ref <150)

## 2011-05-04 NOTE — Discharge Summary (Signed)
NAME:  [name], [name]            ACCOUNT NO.:  [id number]   MEDICAL RECORD NO.:  [id number]          PATIENT TYPE:  INP   LOCATION:  A312                          FACILITY:  [facility]   PHYSICIAN:  Dorris Singh, DO    DATE OF BIRTH:  [birth year]   DATE OF ADMISSION:  09/02/2008  DATE OF DISCHARGE:  09/16/2009LH                               DISCHARGE SUMMARY   ADMISSION DIAGNOSES:  1. Right-sided weakness with facial droop, possible stroke.  2. History of small Alzheimer's dementia.  3. Hypertension.  4. Seizure disorder on Dilantin.   DISCHARGE DIAGNOSES:  1. Left pontine infarct.  2. Right-sided weakness, resolving.  3. Seizure disorder.  4. History of mild Alzheimer's dementia.   PRIMARY MEDICAL DOCTOR:  Kirk Ruths, M.D.   NEUROLOGIST:  Genene Churn. Love, M.D., [place].   Her H&P was done by Dr. Rito Ehrlich, please refer for presenting illness.  To summarize, the patient is an 77 year old Caucasian female who  presented to the [facility] Emergency Room after being found to be  compete confused with right upper arm weakness and facial drooping.  At  that point in time she was admitted to the service of [facility] for a  possible CVA.   HOSPITAL COURSE:  1. Left pontine stroke.  She had an MRI done as well as Dopplers      ordered and an echo.  The radiology results of those tests; on the      14th, she had an MRI done which demonstrated an acute      nonhemorrhagic left paracentral pontine infarct, atrophy without      hydrocephalus and significant small vessel disease.  Also on      September 03, 2008, she had a chest x-ray which was negative.  On      the 15th she also had carotid Doppler which showed minimal plaque      formation of the left carotid systems scattered intimal thickening,      no evidence of hemodynamically significant stenosis.  She also had      an echocardiogram done which demonstrated normal left ventricular      systolic  function with EF 65.  There was trivial aortic valvular      regurgitation.  There were mild fibrocalcific changes aortic root      with affected orifice of much regurgitation by proximal isovelocity      surface area was 0.04, volume mitral valve regurgitation by      proximal isovelocity surface was 8 mL.  There was moderate right      ventricular hypertrophy.  2. Hypertension.  The patient was continued on antihypertensive agents      and she will be discharged on those same agents.  3. Dementia.  She was continued also on those medications and has not      had any agitation or irritation with admission.  4. Urinary incontinence.  A UA was done to check for a UTI, which was      negative.  5. Seizure disorder.  The patient was continued on Dilantin and has  not had any issue.  Dr. Gerilyn Pilgrim was spoken to by Dr. Rito Ehrlich and      he felt that he could follow her outpatient.  Their office was      called for a 1-week appointment and they had mentioned that they      will call her.   DIET:  She will go home on a heart-healthy diet.   ACTIVITY:  She will increase her activity slowly and will also set up  outpatient PT for physical therapy.   DISCHARGE MEDICATIONS:  She will go home on  1. Diclofenac 5 mg p.o. at night.  2. Dilantin 50 mg p.o. at night.  3. Namenda 10 mg p.o. at night.  4. Zolpidem 10 mg p.o. nightly.  5. Benicar 40 p.o. daily.  6. Diclofenac 5 mg p.o. in the a.m.  7. Dilantin 100 mg p.o. in the a.m.  8. Lexapro 10 mg p.o. daily.  9. Namenda 10 mg p.o. in the day.  10.Xanax 0.5 mg p.o. p.r.n.  11.Plavix 75 mg p.o. daily x3 days.  12.She will take Plavix x3 days, then she will be started on Aggrenox,      taking Aggrenox once a day for 3 days and when she stopped Plavix      in 3 days then she will start taking Aggrenox b.i.d.   She is recommended to see her primary care physician in the next 2 weeks  and we will set up outpatient PT for her.   CONDITION:   Stable.   DISPOSITION:  To home.      Dorris Singh, DO  Electronically Signed     CB/MEDQ  D:  09/04/2008  T:  09/04/2008  Job:  [id number]

## 2011-05-04 NOTE — Group Therapy Note (Signed)
NAME:  [name], [name]            ACCOUNT NO.:  [id number]   MEDICAL RECORD NO.:  [id number]          PATIENT TYPE:  INP   LOCATION:  A312                          FACILITY:  [facility]   PHYSICIAN:  Osvaldo Shipper, MD     DATE OF BIRTH:  [birth year]   DATE OF PROCEDURE:  09/03/2008  DATE OF DISCHARGE:                                 PROGRESS NOTE   SUBJECTIVE:  The patient still has weakness in her right upper and lower  extremities, but feels that may be slightly better.  She had some  episode of confusion overnight after she was given Ativan and she was  apparently hallucinating.  Otherwise, she denies any new complaints.   OBJECTIVE:  VITAL SIGNS:  All stable.  Blood pressure is slightly on the  higher side at 154/95, this morning it was 153/73.  LUNGS:  Clear to auscultation bilaterally.  CARDIOVASCULAR:  S1 and S2 is normal, regular.  No murmurs appreciated.  ABDOMEN:  Soft, nontender, and nondistended.  No masses or organomegaly  appreciated.  Bowel sounds are present.  EXTREMITIES:  Do not show any edema.  NEUROLOGY:  She still has right-sided upper and lower extremity  weakness.  Not much change from yesterday.  Please see my H&P.  There  may be subtle improvement, but it is very difficult to say.   LABORATORY DATA:  Labs yesterday were done and CBC was unremarkable.  BMET showed a low potassium of 3.4.  B12 was 1210.  Homocysteine normal,  phenytoin level within therapeutic range.  RPR was nonreactive.   IMAGING STUDIES:  Include MRI that was done yesterday which showed an  acute infarct in the left paracentral pontine area, nonhemorrhagic.  Atrophy without hydrocephalus was noted.  Significant small vessel  disease-type changes were seen.  She had an x-ray of her chest which  showed stable mild cardiomegaly.   EKG was done which showed nonspecific T wave changes in the inferior  leads, otherwise no other acute changes were noted.   ASSESSMENT/PLAN:  1. Acute left pontine  infarct.  The patient has right-sided weakness.      She is undergoing physical therapy.  She had her echocardiogram      done today which is to be read.  Ultrasound of her carotids are      also pending.  I discussed the case with Kofi A. Gerilyn Pilgrim, M.D.,      who recommended starting the patient on Aggrenox.  I spoke to the      patient and she is not taking aspirin because she had some      gastrointestinal intolerance many years ago and there was no other      kind of adverse reaction.  I have told her that we are going to try      the Aggrenox and if she is not able to tolerate, we may have to      just switch her back to Plavix.  Aggrenox will be given once a day      for 3 days and then b.i.d. because of adverse effect of headache.  We will also start her on low dose beta-blocker to get her blood      pressure under better control.  2. Confusion overnight.  The patient does have a history of dementia      which probably predisposes her to confusion.  We will continue with      her Aricept and Namenda and watch her closely.  3. History of seizure disorder, stable.  4. History of depression, stable.   Basically the plan is for the patient to undergo physical therapy,  follow up on the results of the echo and the carotid Dopplers.  No need  to obtain an inpatient neurologic consultation at this time.  This can  be potentially done as an outpatient.  I have already discussed with Dr.  Gerilyn Pilgrim and I will make her an appointment in 1 weeks' time.  I  anticipate if all of the test results are within reasonable limits, the  patient could potentially go home tomorrow, but it also depends on how  she does with physical therapy.      Osvaldo Shipper, MD  Electronically Signed     GK/MEDQ  D:  09/03/2008  T:  09/03/2008  Job:  [id number]   cc:   Darleen Crocker A. Gerilyn Pilgrim, M.D.  Fax: [phone]

## 2011-05-04 NOTE — H&P (Signed)
NAME:  [name], [name] NO.:  [id number]   MEDICAL RECORD NO.:  [id number]          PATIENT TYPE:  INP   LOCATION:  A312                          FACILITY:  [facility]   PHYSICIAN:  Osvaldo Shipper, MD     DATE OF BIRTH:  1923-02-28   DATE OF ADMISSION:  09/02/2008  DATE OF DISCHARGE:  LH                              HISTORY & PHYSICAL   PMD:  Kirk Ruths, M.D. with [facility].   She is also followed up by Dr. Sandria Manly, neurologist, in [place].   ADMISSION DIAGNOSES:  1. Right-sided weakness with facial droop, possibly stroke.  2. History of mild Alzheimer's dementia.  3. Hypertension.  4. Seizure disorder, on Dilantin.   CHIEF COMPLAINT:  Right-sided weakness and unsteady gait since Saturday.   HISTORY OF PRESENT ILLNESS:  Patient is an 77 year old Caucasian female  who was in her usual state of health until Saturday afternoon when she  was found to be slightly confused.  She fell and injured her right upper  arm.  She felt weak on her right side.  The weakness progressed during  Saturday night.  Then on Sunday morning, she felt about the same and  decided to come into her doctor's office on Monday.  From there, she was  taken to the hospital for further evaluation.  She is a direct admit.   She denies any fever, chills.  She has been having headaches off and on  for the past few weeks.  She denies any chest pain, shortness of breath,  no nausea, vomiting, or abdominal pain.  No seizure activity in the last  many years.  She has never had similar symptoms in the past.  She denies  any difficulty with swallowing food.  She denies any visual  difficulties.  She does wear corrective lenses.  She did not have any  syncopal episodes after she fell.   HOME MEDICATIONS:  We do not have a full list of her medications at this  time.  The family is going to bring in all of her medication, but she is  on:  1. Plavix 75 mg daily.  2. Avalide daily.  3. Dilantin 100 mg b.i.d.  4. Lexapro 10 mg daily.  5. Zocor once daily.  6. Possibly even on Namenda; however, we need to obtain a full      medication list for this patient.  7. She may also be on Ambien once daily, Aricept once daily, Xanax as      needed.   ALLERGIES:  SULFA, PENICILLIN, which cause nausea and vomiting.   PAST MEDICAL HISTORY:  1. Hypertension.  2. Seizure disorder for the past many years.  3. Mild dementia.  4. Also a history noted in the past of Parkinson's, but I am not sure      if she is still on medications.  5. Depression.  6. Dyslipidemia.  7. Denies any history of heart trouble.  No lung disease, that she      knows of.  8. Nephrolithiasis and has undergone surgery for kidney stones many  years ago.  9. Hysterectomy.  10.Knee arthroscopies in the past.   SOCIAL HISTORY:  She lives in [place].  Her daughter lives either  with her or right next to her.  She denies smoking, alcohol, or illicit  drug use.  Prior to this episode, she was independent with the daily  activities.  She did require assistance with some of her ADLs from her  daughters.   FAMILY HISTORY:  Noncontributory.   REVIEW OF SYSTEMS:  GENERAL:  Positive for weakness, malaise, poor p.o.  intake.  HEENT:  Unremarkable.  CARDIOVASCULAR:  Unremarkable.  RESPIRATORY:  Unremarkable.  GI:  Unremarkable.  GU:  She did mention  urinary incontinence.  No bowel incontinence.  Some dysuria.  MUSCULOSKELETAL:  Unremarkable.  NEUROLOGIC:  As in HPI.  PSYCHIATRIC:  Unremarkable.  DERMATOLOGIC:  Unremarkable.   PHYSICAL EXAMINATION:  VITAL SIGNS:  Are being done at this time.  I do  not have them with me.  I will check them as soon as this dictation is  done.  GENERAL:  A pleasant, elderly white female in no distress.  HEENT:  There is no pallor, no icterus.  Oral mucous membranes are  moist.  Pupils are equal and reactive to light.  NECK:  Supple.  Soft.  No thyromegaly is appreciated.   No  lymphadenopathy is present.  LUNGS:  Clear to auscultation bilaterally.  No rales, wheezes, or  rhonchi.  CARDIOVASCULAR:  S1 and S2 is normal, regular.  No murmurs appreciated.  No S3 or S4.  No rubs, no bruits.  ABDOMEN:  Soft, nontender, nondistended.  Bowel sounds are present.  No  mass or organomegaly is appreciated.  EXTREMITIES:  No edema.  GU:  Deferred.  MUSCULOSKELETAL:  Unremarkable.  NEUROLOGIC:  She is alert and oriented x3.  No focal neurological  deficits are present.  Cranial nerves:  She does have evidence for a  facial droop on the right side.  Tongue also deviates somewhat to the  right side.  Motor strength on the right upper extremity:  There is  definite weakness, compared to the left.  The strength is about a 4-5 in  the wrist and elbow flexores and extensors, but when it comes to  shoulder, it is almost a 3/5.  The right lower extremity is also about 4-  5 in strength, all groups.  Plantars are downgoing on the left side.  Right side is equivocal.  Sensory exam was unremarkable.  Cerebellar  signs are slightly poor on the right side.  Finger to nose was  equivocal.  She did have pronator drift on the right side.  Gait was not  assessed at this time.   LABS:  Pending.   MRI of the brain is pending.   Chest x-ray, EKG, UA, etc. are all pending.   ASSESSMENT:  This is an 77 year old Caucasian female with a past medical  history as stated earlier, presents with a three-day history of  weakness, unsteady gait, right-sided weakness, who definitely has  evidence for right-sided weakness on clinical examination.  She possibly  has had a stroke.  The patient will be in the hospital for further  evaluation and workup.   PLAN:  1. Possibly acute stroke:  An MRI will be done, carotid Dopplers,      echocardiogram, DVT prophylaxis, EKG, chest x-rays will be done.      She will be monitored on telemetry.  UA will be sent off too.  RPR,  homocysteine, B12  levels will also be checked.  PT evaluation will      be done.  I have spoken to the radiology staff, and they will be      able to do an MRI this evening because if they were not able to do      so, I would rather get a CAT scan to make sure there is no evidence      for bleeding in the brain.  She has not had a CAT scan yet.  2. Hypertension:  Continue with her antihypertensive agents.  3. Dementia.  4. Urinary incontinence:  We will check a UA and proceed from there.  5. Seizure disorder:  Continue with the Dilantin, once we get the      proper doses that can be initiated.  6. For now, we will continue with Plavix.  If she does have a stroke,      she may be a candidate for maybe even aspirin or Aggrenox, which I      will discuss with Dr. Gerilyn Pilgrim if needed.  A neurology consult may      be obtained, either as an inpatient or an outpatient, depending on      her evaluation.      Osvaldo Shipper, MD  Electronically Signed     GK/MEDQ  D:  09/02/2008  T:  09/02/2008  Job:  [id number]   cc:   Kirk Ruths, M.D.  Fax: [phone]

## 2011-05-07 NOTE — Discharge Summary (Signed)
NAME:  [name], [name]                      ACCOUNT NO.:  [id number]   MEDICAL RECORD NO.:  [id number]                   PATIENT TYPE:  INP   LOCATION:  A202                                 FACILITY:  [facility]   PHYSICIAN:  Sarita Bottom, M.D.                  DATE OF BIRTH:  November 21, 1923   DATE OF ADMISSION:  10/19/2002  DATE OF DISCHARGE:                                 DISCHARGE SUMMARY   HISTORY:  The patient is a 77 year old lady with a history of hypertension  who was admitted on the 31st of October when she presented to the emergency  room with jerky movements.  The patient had seen her primary doctor earlier  in the day and the primary doctor noted the patient has been having jerky  movements and the jerky movements were attributed to a new medication,  Risperdal, that patient had been started on.  The patient was therefore  brought to the emergency room and her admitting diagnosis was extrapyramidal  symptoms secondary to Risperdal.  The patient also had seizures while in the  emergency room and the seizures were attributed to Ativan withdrawal.  Her  clinical course has been  significant for evaluation by neurology and also  for development of hyponatremia for which she was seen by renal consult.  Her sodium has since been corrected.  Neurology gave an EEG which was  negative.  The patient initially had been put on Dilantin but neurology  recommended discontinuation of Dilantin.  The patient also had episodes of  confusion for which was evaluated on admission but these were attributed to  sundowning.  The patient was seen today on rounds and she has no new  complaints.   PHYSICAL EXAMINATION:  Her vital signs are stable.  Physical examination  essentially unremarkable.   LABORATORY DATA:  Her latest laboratory data shows a WBC of 10.9, hemoglobin  of 11.4, hematocrit of 32.9, MCV of 86.5, platelet count of 268,000.  Sodium  of 135, potassium of 3.5, chloride of 108, CO2 of  25, BUN of 21, creatinine  of 1, glucose of 92.  A CT done earlier when she came in was significant for  chromic ischemic white matter disease.   DISCHARGE DIAGNOSES:  1. New onset seizure probably secondary to benzodiazepine withdrawal.  2. Extrapyramidal symptoms secondary to Risperdal which has since resolved.  3. Other problems include hypertension and anxiety.   DISCHARGE INSTRUCTIONS:  The patient will be discharged home to follow up  with the primary M.D., Elvina Sidle, M.D., and also to follow up with  neurology.  The patient will also need a home health aide and a visit from  nurse to assess her home situation.   CONDITION ON DISCHARGE:  Satisfactory and stable.    DISCHARGE MEDICATIONS:  1. Avapro 300 mg p.o. q.d.  2. Ativan 0.5 mg q. h.  3. Norvasc 5 mg p.o. q.d.  Sarita Bottom, M.D.    DW/MEDQ  D:  10/24/2002  T:  10/25/2002  Job:  [id number]   cc:   Elvina Sidle, M.D.  [address] [place]  [place] [address]  Fax: [phone]

## 2011-05-07 NOTE — H&P (Signed)
NAME:  [name], [name]                      ACCOUNT NO.:  [id number]   MEDICAL RECORD NO.:  [id number]                   PATIENT TYPE:  INP   LOCATION:  IC04                                 FACILITY:  [facility]   PHYSICIAN:  Corrie Mckusick, M.D.               DATE OF BIRTH:  January 23, 1923   DATE OF ADMISSION:  07/24/2004  DATE OF DISCHARGE:                                HISTORY & PHYSICAL   ADMISSION DIAGNOSIS:  Seizure x2.   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  Dr. Nobie Putnam  had admitted her on August 3, due to having dizziness and lightheadedness.  He held both of her antihypertensives during that hospital stay and she  began to improve from a lightheadedness and dizzy standpoint.  It was felt  that she was hypotensive and that is why she had these spells.   She went home on no antihypertensives with normal blood pressures at home,  but then came into the emergency department that evening with a new seizure.  It is unclear whether she took her other home medications once she was  discharged and at home.  She does take benzodiazepines at times at home and  I suspect she could have not taken those and this could possibly be a  benzodiazepine withdrawal.   [name] is well-known to Dr. Sandria Fitzpatrick in the neurology department as well as  myself.  She has had a question of a Parkinson's diagnosis in the past and  does have the diagnosis of dementia, possible Alzheimer's type.  She has  been on Aricept and Namenda for some time with good cognitive improvement.  She did have a seizure back in the Fall of 2003, which was felt to be due to  benzodiazepine withdrawal at that time.  Dr. Gerilyn Pilgrim saw her at that time.   On the prior admission, a CT scan was done which was unremarkable.  She had  no headaches or other focal neurologic  effects during that admission.  Now,  she has a left periorbital headache, but otherwise no other neurological  symptomatology.  There are no focal deficits or other weaknesses.  There is  no visual disturbances.  Vital signs in the emergency room were quite normal  as well as electrolytes.   REVIEW OF SYMPTOMS:  Cardiovascular, respiratory, GI review of systems were  all negative.   MEDICATIONS PRIOR TO ADMISSION:  1. Aricept 2 mg daily.  2. Sinemet 25/100 daily at bedtime.  3. Ativan 1 mg p.o. t.i.d. p.r.n.  4. Plavix 75 mg daily.  5. Celexa 20 mg daily.  6. Lipitor 20 mg daily.  7. Namenda 10 mg b.i.d.   ALLERGIES:  PENICILLIN.  ASPIRIN.   For past medical history, social history and family history, please see  recent  H&P from Dr. Nobie Putnam.  It should be noted that the patient reports  no alcohol use whatsoever.   PHYSICAL EXAMINATION:  VITAL SIGNS:  Blood pressure 138/85, pulse 98,  respirations 16, O2 saturations 99% on room air.  GENERAL:  A pleasant, talkative female when I saw her, but somewhat  confused, in no acute distress.  HEENT:  Nasopharynx and oropharynx are clear.  Normocephalic, atraumatic.  Pupils equal round and reactive to light.  Extraocular movements intact.  NECK:  Supple with no lymphadenopathy.  CHEST:  Clear to auscultation bilaterally.  CARDIAC:  Regular rate and rhythm with normal S1, S2.  No S3, S4, murmurs,  gallops or rubs.  ABDOMEN:  Soft, nontender, nondistended.  EXTREMITIES:  No clubbing, cyanosis or edema.  NEUROLOGIC:  Cranial nerves 2-12 grossly intact.  Pupils equal round and  reactive to light.  Nonfocal exam.  Strength is 5/5 throughout.  Sensation  intact.   LABORATORY DATA AND X-RAY FINDINGS:  Overall, unremarkable.  CT scan  negative per ER physician.   ASSESSMENT:  A 77 year old female with the above-stated medical problems who  presents with two episodes of seizure activity.   PLAN:  1. She was loaded with Dilantin in the  emergency department.  2. Neurology consult was obtained via [facility] and set up for transfer     there once a bed is available.  3. MRI/EEG needed.  4. Continue benzodiazepines now as this could be from benzodiazepine     withdrawal.  5. Monitor blood pressure closely as she continues to be mostly normotensive     off of her medications.  6. Watch for any electrolyte changes.  7. Will monitor in the ICU closely.     ___________________________________________                                         Corrie Mckusick, M.D.   JCG/MEDQ  D:  07/25/2004  T:  07/25/2004  Job:  [id number]

## 2011-05-07 NOTE — Consult Note (Signed)
NAME:  [name]                      ACCOUNT NO.:  [id number]   MEDICAL RECORD NO.:  [id number]                   PATIENT TYPE:  INP   LOCATION:  A202                                 FACILITY:  [facility]   PHYSICIAN:  Jorja Loa, M.D.             DATE OF BIRTH:  1923-01-23   DATE OF CONSULTATION:  10/22/2002  DATE OF DISCHARGE:                                   CONSULTATION   REASON FOR CONSULTATION:  Hyponatremia.   HISTORY OF PRESENT ILLNESS:  The patient is a 77 year old Caucasian female  with a history of hypertension, anxiety disorder, presently admitted because  of increased agitated movement.  According to her note, the patient was  started on Risperdal, and now she is not showing any sign or symptom of  possible seizure and, hence, admitted to the hospital.  During her admission  the patient was found to have hyponatremia and since her sodium did not  improve, consult is obtained.  There is no previous history of hyponatremia.  She used to be on ________.  Again, she has a history of kidney stone.   PAST MEDICAL HISTORY:  1. As stated above, the patient has a history of hypertension.  2. History of kidney stone.  3. Status post hysterectomy.  4. Status post arthroscopic knee surgery.  5. History of anxiety disorder.   MEDICATIONS:  1. Avapro 300 mg p.o. q.d.  2. Ativan 0.5 mg p.o. q.h.s.  3. Dilantin 300 mg p.o. q.d.  4. She is getting normal saline at 20 cc/hr.  5. Tylenol on p.r.n. basis.   SOCIAL HISTORY:  No history of smoking.  No history of alcohol abuse.  She  has four children who are grown up.   ALLERGIES:  She is allergic to PENICILLIN.   FAMILY HISTORY:  Significant for MI in her father, kidney disease in her  mother and history of cerebral aneurysm.   REVIEW OF SYSTEMS:  Except feeling weak, no complaints.  No nausea or  vomiting, but she had nausea and vomiting about three to four days ago.  At  this moment the patient offers no  complaints.  No seizures.  Occasionally  feels thirsty.   PHYSICAL EXAMINATION:  GENERAL:  Alert.  In no apparent distress.  VITAL SIGNS:  Blood pressure 122/65, temperature 98.6.  HEENT:  No conjunctival pallor.  No icterus. Oral mucosa seems to be  somewhat dry.  NECK:  Supple.  No JVD.  CHEST:  Clear to auscultation.  No rales, no rhonchi.  HEART:  Regular rate and rhythm.  No murmur.  ABDOMEN:  Soft.  Positive bowel sounds.  EXTREMITIES:  No edema.   LABORATORY DATA:  Her 24-hour input is 1700, output is 900.  The patient's  blood work from today showed her sodium to be 121, and her potassium 3.5,  chloride 90, CO2 of 26, BUN 22, creatinine 1.1.  Her serum osmolality is 65,  calcium  of 8.9.  Her cholesterol is 223.  Urine osmolality is 294, chloride  is 15, sodium is 110 and potassium is ________.   ASSESSMENT:  1. Hyponatremia.  At this moment propensity to hyponatremia as her serum     osmolality is 65, which is low.  The etiology for her hyponatremia in a     patient who has borderline low blood pressure and normal or high BUN to     creatinine ratio with low chloride and also previous history of     hypokalemia and high normal CO2 possibly could be secondary to     intravascular volume depletion with hypotonic fluid replacement.  2. History of seizure of new onset:  Etiology not clear.  3. History of anxiety disorder.  4. History of hypertension:  Blood pressure seems to be borderline low.   RECOMMENDATIONS:  1. Will hydrate the patient.  2. With her intake will liberalize her salt and will follow with you.                                               Jorja Loa, M.D.    BB/MEDQ  D:  10/22/2002  T:  10/22/2002  Job:  [id number]

## 2011-05-07 NOTE — H&P (Signed)
NAME:  [name], [name]                      ACCOUNT NO.:  [id number]   MEDICAL RECORD NO.:  [id number]                   PATIENT TYPE:  INP   LOCATION:  3010                                 FACILITY:  [facility]   PHYSICIAN:  Santina Evans A. Orlin Hilding, M.D.          DATE OF BIRTH:  1923/10/31   DATE OF ADMISSION:  07/25/2004  DATE OF DISCHARGE:                                  HISTORY & PHYSICAL   CHIEF COMPLAINT:  Seizure.   HISTORY OF PRESENT ILLNESS:  [name] is 77 year old, right-handed,  white woman with a history of Alzheimer's type dementia, history of  questionable TIAs, although it is not clear how they were characterized, and  a questionable history of Parkinson's on a homeopathic dose of Sinemet.  She  apparently is well-known to Dr. Sandria Manly, according to Dr. Lamar Blinks note.  She apparently had a seizure in the Fall of 2003, and was seen by a  neurologist in the [place] area, Dr. Gerilyn Pilgrim, who felt it to be  benzodiazepine withdrawal as the patient is on chronic benzodiazepine.  It  looks like she was admitted to [facility] on August 3, about 3 days  ago for some spells, possibly seizures.  She was found to be hypotensive and  she complaining of being dizzy.  This might have been syncope.  Her  antihypertensives were discontinued and she was discharged.  It then appears  that she was returned to the hospital on August 5, yesterday with a seizure.  Telephone conversation with Dr. Vickey Huger was obtained and she suggested an  outpatient workup.  The patient returned home, but then apparently had a  second seizure, so she came back to the [facility] Emergency Room where she  was admitted and loaded with Dilantin.  According to those notes, Dr.  Vickey Huger supposedly agreed to accept her in transfer at [facility] today,  August 6.  The patient has a history of dementia and she is alone here now  without family, so all of the other history obtained from the patient may be  unreliable.   REVIEW OF SYMPTOMS:  CONSTITUTIONAL:  She denies any headache currently, but  has had intermittent headache off and on for several years and earlier in  this admission.  CARDIOVASCULAR:  She denies chest pain or shortness of  breath.  GASTROINTESTINAL:  She denies abdominal pain.  HEENT:  No visual  complaints.   PAST MEDICAL HISTORY:  1. Hypertension, although recently hypotensive, so her remaining     hypertensives were discontinued.  2. Dementia, presumably of the Alzheimer's type and a question of     Parkinson's disease on a very low dose Sinemet reportedly followed by Dr.     Sandria Manly.  3. She is status post hysterectomy.  4. Kidney stones.  5. Cataract surgery.  6. There is a report of seizures in 2003, questionably benzodiazepine     withdrawal.  7. History of TIAs, although how that is  characterized is not clear and     history of presumably chronic anxiety as she has been on chronic     benzodiazepine.   MEDICATIONS:  Aricept, Namenda, Sinemet, either Valium or Ativan, Plavix,  Celexa, Lipitor and most recently Dilantin.   ALLERGIES:  PENICILLIN, ASPIRIN, SULFA.   SOCIAL HISTORY:  According to the patient, she is widowed and lives with one  of her daughters.  No cigarette or alcohol use.   FAMILY HISTORY:  Noncontributory.   PHYSICAL EXAMINATION:  VITAL SIGNS:  Temperature 98.7, blood pressure  131/75, pulse 91, respirations 20, 100% saturations on room air.  HEENT:  Head is normocephalic, atraumatic.  NECK:  Supple without bruits.  HEART:  Regular rate and rhythm.  LUNGS:  Clear to auscultation.  ABDOMEN:  Soft, nontender with positive bowel sounds.  EXTREMITIES:  She has a few ecchymoses.  NEUROLOGIC:  Mental status:  She is awake and alert.  She is oriented to  [facility] in [place] in August 2005, but not day or date.  She  is able to register 3/3 items, but can only recall one.  She can spell  world forward.  Backwards, she spells it  DLER.  She has normal fluent  spontaneous language.  Pupils equal round and reactive to light.  Visual  folds are full.  Extraocular movements intact.  Facial sensation is normal.  Motor activity is normal.  Hearing is intact.  Palate is symmetric.  Tongue  is midline and there is no evidence of any tongue lacerations.  Motor exam  with no drift or satelliting.  She has normal rapid fine movement.  Normal  bulk, tone and strength throughout.  She ambulates with contact assistance.  Deep tendon reflexes are 1+ with downgoing toes.  Coordination:  Finger-to-  nose, heel-to-shin are intact.  Sensory exam is intact.   LABORATORY DATA AND X-RAY FINDINGS:  CT scan of the brain shows nothing  acute with chronic white matter disease.   IMPRESSION:  1. Seizure, by history, unclear etiology.  Previous history of seizures     secondary to benzodiazepine withdrawal.  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  There is no evidence of an acute central nervous system event,     however, labs are unremarkable.  2. Dementia.   PLAN:  1. Continue dementia medications.  2. Continue Dilantin.  Will put her on a maintenance dose of 300 mg per day.  3. She will need to have EEG and MRI checked.  4. Hopefully to home soon.                                                Catherine A. Orlin Hilding, M.D.    CAW/MEDQ  D:  07/25/2004  T:  07/25/2004  Job:  [id number]

## 2011-05-07 NOTE — Procedures (Signed)
   NAME:  [name], [name]                      ACCOUNT NO.:  [id number]   MEDICAL RECORD NO.:  [id number]                   PATIENT TYPE:  INP   LOCATION:  A202                                 FACILITY:  [facility]   PHYSICIAN:  Kofi A. Gerilyn Pilgrim, M.D.              DATE OF BIRTH:  [birth year]   DATE OF PROCEDURE:  DATE OF DISCHARGE:                                EEG INTERPRETATION   HISTORY:  This is a 77 year old lady who is suspected of having possible  seizures.   ANALYSIS:  A 16 channel recording is conducted for approximately 20 minutes.  There is a Beta background rhythm of 22-24 Hz.  Awake and sleep architecture  is seen.  Stage II of sleep with K complexes and sleep spindles are  observed.  Photic stimulation did not elicit any abnormal responses.  There  is no focal slowing or epileptiform activity seen.   IMPRESSION:  This is a normal recording of the awake and sleep states.                                               Kofi A. Gerilyn Pilgrim, M.D.    KAD/MEDQ  D:  10/23/2002  T:  10/23/2002  Job:  [id number]
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
## 2011-05-07 NOTE — Discharge Summary (Signed)
NAME:  [name], [name]                      ACCOUNT NO.:  [id number]   MEDICAL RECORD NO.:  [id number]                   PATIENT TYPE:  INP   LOCATION:                                       FACILITY:  [facility]   PHYSICIAN:  Deanna Artis. Sharene Skeans, M.D.           DATE OF BIRTH:  [birth year]   DATE OF ADMISSION:  07/25/2004  DATE OF DISCHARGE:  07/27/2004                                 DISCHARGE SUMMARY   FINAL DIAGNOSES:  1. New onset of seizures (345.10).  2. Dementia (331.0).  3. Hypertension (404.10).  4. Depression/anxiety.  5. Dyslipidemia.   HOSPITAL COURSE:  The patient was transferred from [facility] where  she was seen and discharged for a bout of hypotension.  She had been  admitted with dizziness and lightheadedness.   The patient had a seizure in the fall of 2003 thought to be related to  benzodiazepine withdrawal and was seen by Dr. Gerilyn Pilgrim.  The patient was  admitted to [facility] and transferred to [facility] after she had  a series of two seizures.  CT scan of the brain carried out on the prior  hospitalization August 3 was unremarkable.  The patient had no focal  symptomatology.  She was brought to [facility] for further  evaluation.   The patient had MRI of the brain without contrast that showed evidence of  small vessel disease, but no acute lesions.  EEG performed this morning was  essentially normal in the waking state and drowsiness.  There was no focal  swelling and no intra __________  spikes or sharp waves.  Dilantin level is  18 mcg/mL.  White count 10,600, hemoglobin 11.3, __________ 32.2, platelet  count 300,000.  Sodium 140, potassium 3.4, chloride 108, CO2 25, BUN 12,  creatinine 1.1, glucose 118, calcium 8.6.  SGOT 20, SGPT 16, alkaline  phosphatase 56, total bilirubin 0.8, albumin 3.7, protein 6.7.   This morning the patient is at her baseline.  She is pleasant.  Vital signs:  Temperature 99.2.  Repeat temperature  afebrile.  Blood pressure 159/98,  resting pulse 93, respirations 20, pulse oximetry 99%.  The patient has a  nonfocal neurologic examination.   DISCHARGE MEDICATIONS:  1. She is discharged home on the medication Phenytek 300 mg one at bedtime.  2. Her other medications which will be continued include Aricept 10 mg at     bedtime.  3. Namenda 10 mg b.i.d.  4. Plavix 75 mg daily.  5. Sinemet 25/100 daily.  I would change it to suppertime.  It is being     given at bedtime.  This will decrease chances for orthostatic hypotension     as well as hallucinations.  6. Celexa 20 mg daily.  7. Zocor 40 mg daily.   The patient should engage in a prudent diet of low fat and low salt.  She  should return to see Darrol Angel,  our nurse practitioner on a day when  Dr. Avie Echevaria is in the office for follow up in one to two months.  Telephone number [phone].  She is discharged in improved condition.                                                Deanna Artis. Sharene Skeans, M.D.    [facility]  D:  07/27/2004  T:  07/28/2004  Job:  [id number]   cc:   Corrie Mckusick, M.D.  [address] [place] [place]  [place]  [place] [address]  Fax: [phone]

## 2011-05-07 NOTE — H&P (Signed)
NAME:  [name], [name]                      ACCOUNT NO.:  [id number]   MEDICAL RECORD NO.:  [id number]                   PATIENT TYPE:  OBV   LOCATION:  A211                                 FACILITY:  [facility]   PHYSICIAN:  Patrica Duel, M.D.                 DATE OF BIRTH:  16-Aug-1923   DATE OF ADMISSION:  07/22/2004  DATE OF DISCHARGE:                                HISTORY & PHYSICAL   CHIEF COMPLAINT:  Dizziness.   HISTORY OF PRESENT ILLNESS:  This is a 77 year old female with a history of  hypertension, mild dementia, and probable recurrent TIA's.  She has been  evaluated by Dr. Sandria Manly in the past, is a patient of Dr. Lamar Blinks.   The patient was seen in the office this past week with a week long history  of lightheadedness and dizziness.  She was treated symptomatically.  She has  not improved and is somewhat worse, particularly when walking.  She has  experienced no significant headache or focal neurological deficits.   In the emergency department, the patient underwent CT scanning which was  benign.  She also was found to have mild renal insufficiency with a  creatinine of 1.8.   The patient is admitted with progressive ataxia and dizziness.   There is no history of headache or other neurologic deficits, nausea,  vomiting, diarrhea, melena, hematemesis, or hematochezia.  No urogenital  symptoms.  She also has chest pain, shortness of breath, and syncope.   Incidentally, the patient was found to have a D-dimer elevated at 3.  A V/Q  scan is currently pending.   MEDICATIONS AT HOME:  1. Aricept 10 mg daily.  2. Norvasc 5 mg daily.  3. Sinemet 25/100 daily.  4. Vicodin p.r.n.  5. Flexeril 5 mg daily.  6. Xanax p.r.n. 1 mg b.i.d.  7. Ativan 1 mg p.r.n.  8. Plavix 75 mg daily.  9. Lipitor 20 mg daily.  10.      Celebrex 200 mg daily.  11.      Avalide 300/12.5 mg daily.  12.      Namenda 10 mg b.i.d.  13.      Celexa 40 mg daily.   ALLERGIES:  1. PENICILLIN.  2.  ASPIRIN.   PAST MEDICAL HISTORY:  As noted above.   SOCIAL HISTORY:  The patient lives alone.  Denies the use of alcohol or  tobacco.   REVIEW OF SYSTEMS:  Negative except as mentioned.   FAMILY HISTORY:  Noncontributory.   PHYSICAL EXAMINATION:  GENERAL:  A very pleasant female in no acute  distress.  She appears to be fully alert.  VITAL SIGNS:  Blood pressure 120/73.  Repeat blood pressure of 130/80  laying, dropping to 90/60 standing, heart rate increased approximately 10 to  20 beats per minute.  Respirations are 18, O2 saturation 96%.  HEENT:  Normocephalic, atraumatic.  Pupils are equal and reactive.  Ears,  nose, and throat are benign.  NECK:  Supple.  No bruits are audible.  HEART:  Heart sounds are somewhat distant.  No murmurs, rubs, or gallops.  LUNGS:  Clear.  ABDOMEN:  Nontender, nondistended.  Bowel sounds are intact.  EXTREMITIES:  No cyanosis, clubbing, or edema.  NEUROLOGIC:  Within normal limits.  Totally nonfocal.  She is able to stand,  but has mild dizziness with postural changes.   ASSESSMENT:  Dizziness, probably secondary to vertebral basilar  insufficiency and postural hypotension secondary to multiple  antihypertensive medications.  She apparently has been felt to have  Parkinson's disease, and could have a component of dysautonomia.  I doubt  significant new neurologic insult at this time.  Elevated D-dimer of  questionable significance.  She has had no symptoms of pulmonary embolism or  deep vein thrombosis, and her O2 saturation is 96% on room air.  Mental  status at this time is remarkably good.   PLAN:  Admit for observation, hold her blood pressure medications pending  her response to low dose fluids.  I will continue Lipitor, Plavix, and  aspirin, consider coumadization pending her progress.  We will follow her  expectantly.     ___________________________________________                                         Patrica Duel, M.D.    [facility]  D:  07/22/2004  T:  07/22/2004  Job:  [id number]
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
## 2011-05-07 NOTE — Procedures (Signed)
PROCEDURE:  Electroencephalogram.   PHYSICIAN:  Deanna Artis. Sharene Skeans, M.D.   INDICATIONS FOR PROCEDURE:  The patient is a 77 year old with a history of  seizures and dementia.  This study is being done to look for the presence of  a seizure focus.   DESCRIPTION OF PROCEDURE:  This tracing is carried out on a Pharmacist, hospital, reformated into 16-channel montages with one  devoted to electrocardiogram.  The patient was awake during the recording.  The International 10/20 System lead placement was used.  Medications include  Aricept, Namenda, Sinemet, Valium, Ativan, Plavix, Celexa, Lipitor and  Dilantin.  The International 10/20 System lead placement was used.   FINDINGS:  Dominant frequency is a 9-10 Hz, 15-20 microvolt activity that is  prominent in the posterior regions and attenuates partially with eye  opening.  Background activity consists of a mixture of predominantly alpha  range components with occasional mixed frequency upper theta range  components superimposed.  The patient becomes drowsy with occasional  rhythmic theta and upper delta range components seen.  Intermittent photic  stimulation induced a driving response at about 5 Hz and 7 Hz that was  continuous, and then harmonics were seen at higher frequencies.  There was  no focal slowing in the background.  There was no intra-ictal epileptiform  activity in the form of spikes or sharp waves.  Hyperventilation was not  carried out.  The electrocardiogram showed a regular sinus rhythm with a ventricular  response of 84 beats per minute.   IMPRESSION:  Essentially normal record with the patient awake and drowsy.    WILLIAM H. Sharene Skeans, M.D.   RUE:AVWU  D:  07/27/2004 11:58:48  T:  07/27/2004 13:00:34  Job #:  [id number]   cc:   Melvyn Novas, M.D.  [address]  [address]  [place]  [place] [address]  Fax: [phone]

## 2011-09-08 LAB — CBC
HCT: 35.3 — ABNORMAL LOW
Hemoglobin: 12.3
MCV: 91.8
RBC: 3.85 — ABNORMAL LOW
WBC: 5.9

## 2011-09-08 LAB — DIFFERENTIAL
Eosinophils Absolute: 0.1
Eosinophils Relative: 1
Lymphs Abs: 1.4
Monocytes Absolute: 0.5
Monocytes Relative: 9

## 2011-09-08 LAB — BASIC METABOLIC PANEL
CO2: 28
Chloride: 104
GFR calc Af Amer: >60
Potassium: 3.2 — ABNORMAL LOW

## 2011-09-09 IMAGING — CT CT CERVICAL SPINE W/O CM
3 of 5 series · 12 of 33 positions shown, 14 images · non-contrast
Comparison: 10/30/2010

CLINICAL DATA: Fall with head and neck pain.

CT HEAD WITHOUT CONTRAST
TECHNIQUE: Contiguous axial images were obtained from the base of
the skull through the vertex without contrast
TECHNIQUE: Continous axial images were obtained of the cervical
spine without contrast.  Sagittal and coronal reformats were
constructed.

[Series 6: cervical st 2.0 b31s · axial · 0.23mm/px · z∈[-300,-206]mm · 4 of 79 slices shown, 5 images]
[im 16/79  soft-tissue]
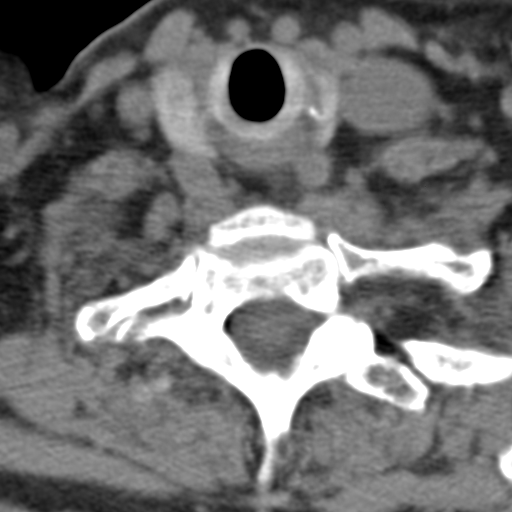
[im 16/79  bone]
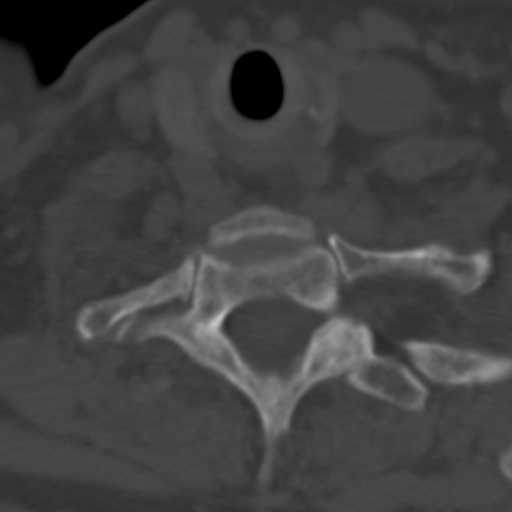
[im 32/79  bone]
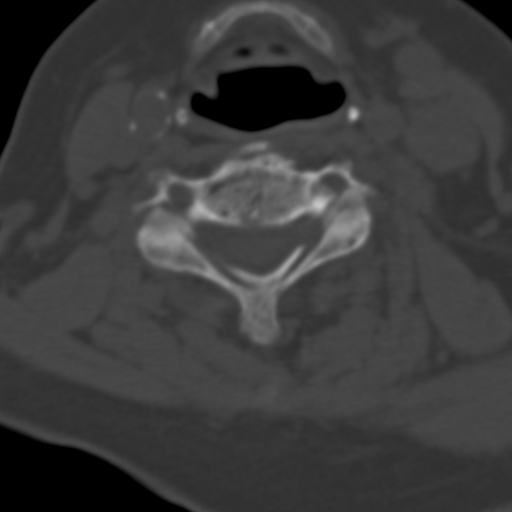
[im 47/79  bone]
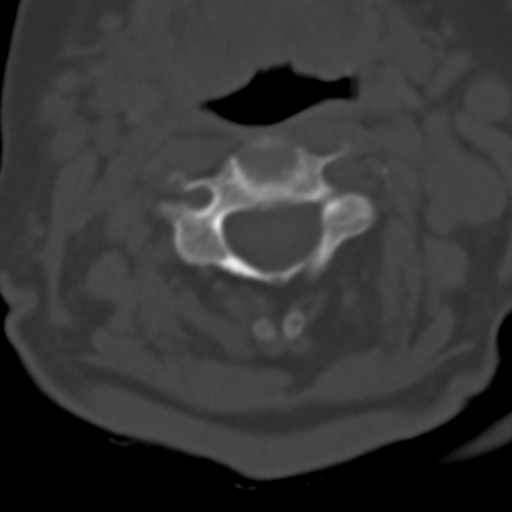
[im 63/79  bone]
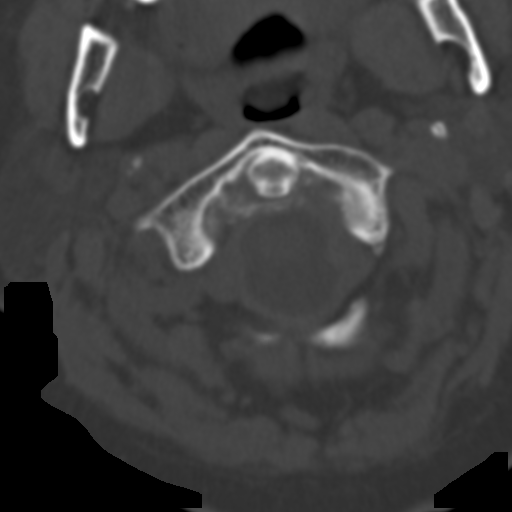

[Series 8: sagittal bone 2.0 · sagittal · 0.25mm/px · 5 of 37 slices shown, 6 images]
[im 13/37  bone]
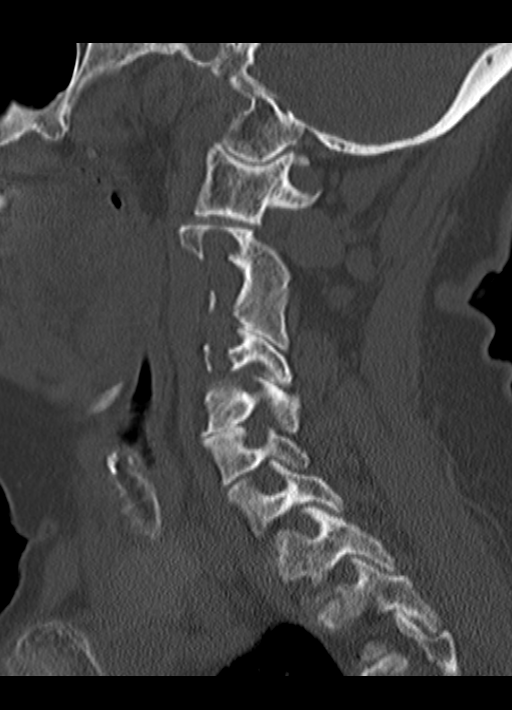
[im 16/37  bone]
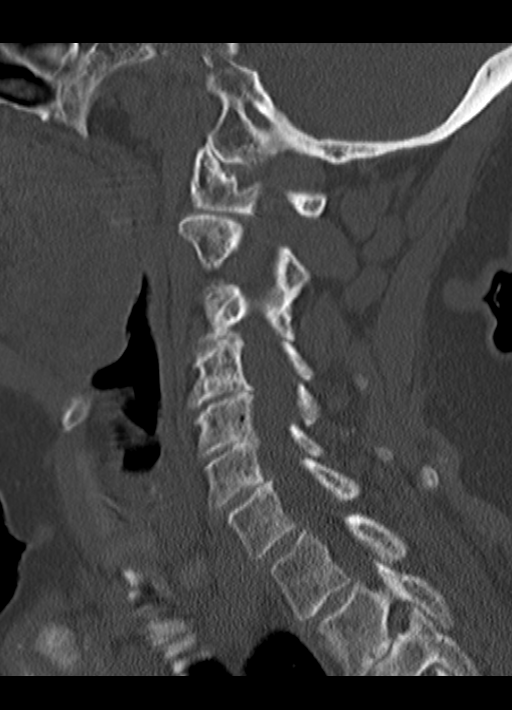
[im 19/37  soft-tissue]
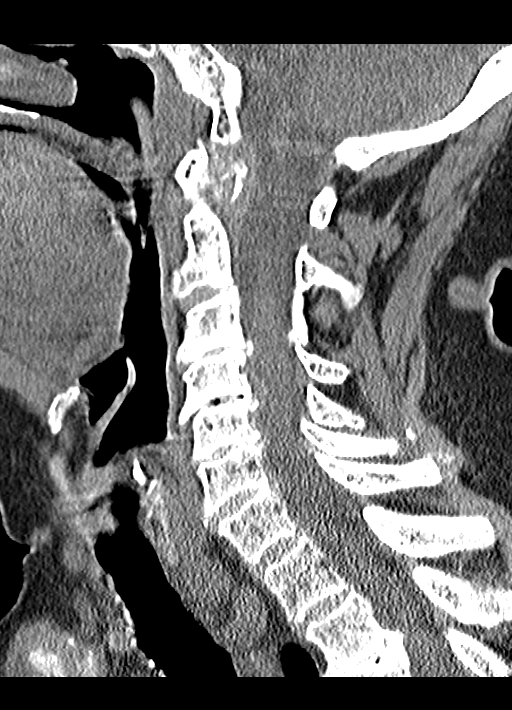
[im 19/37  bone]
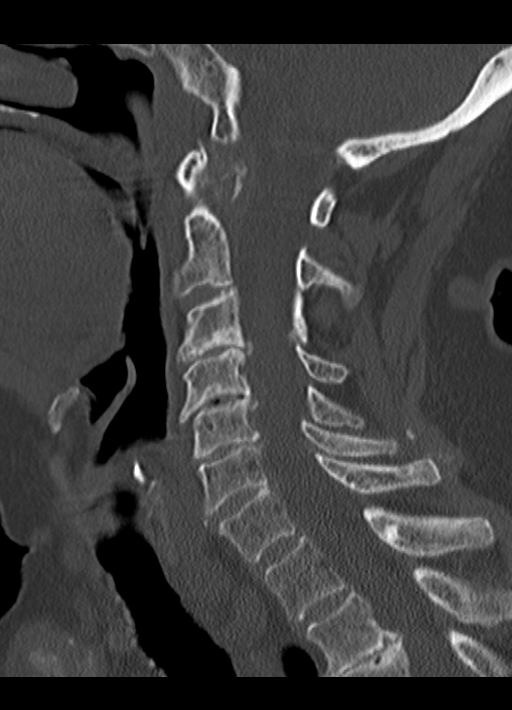
[im 22/37  bone]
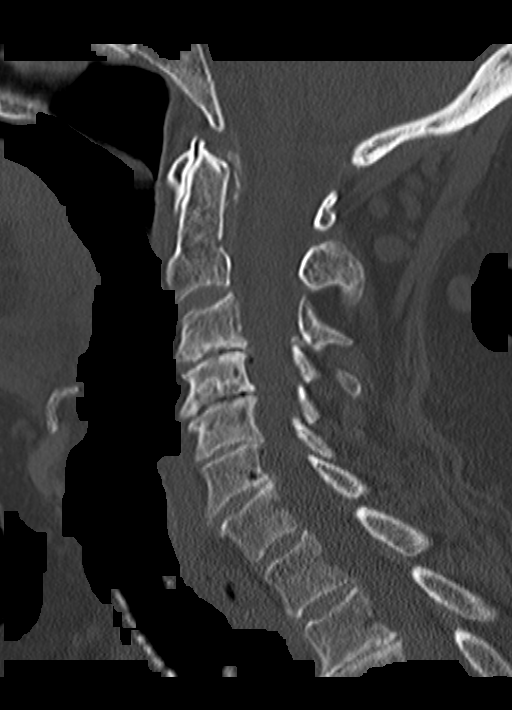
[im 25/37  bone]
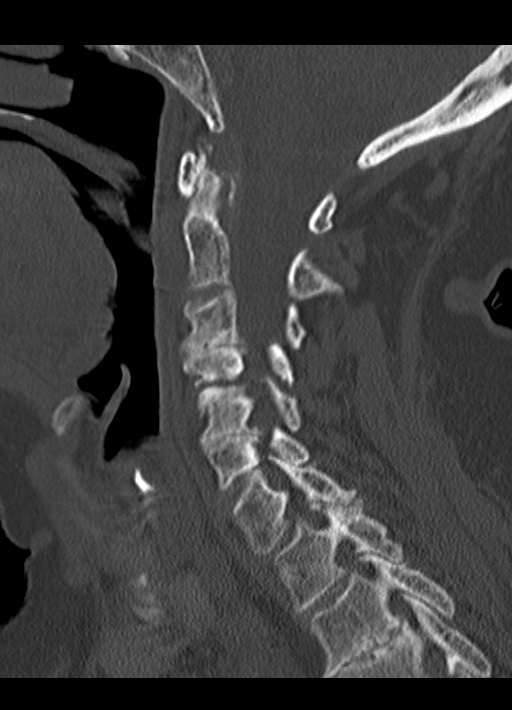

[Series 9: coronal bone 2.0 · coronal · 0.19mm/px · 3 of 31 slices shown]
[im 7/31  bone]
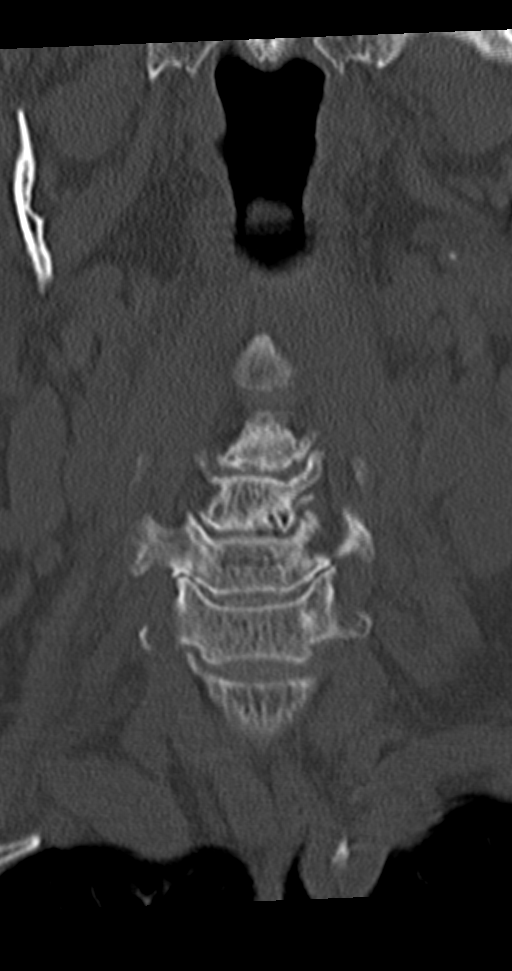
[im 13/31  bone]
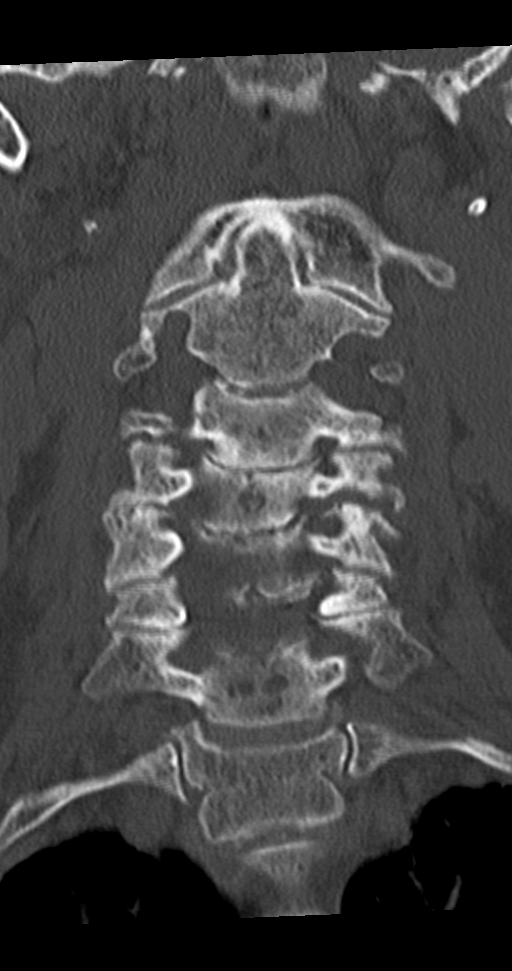
[im 19/31  bone]
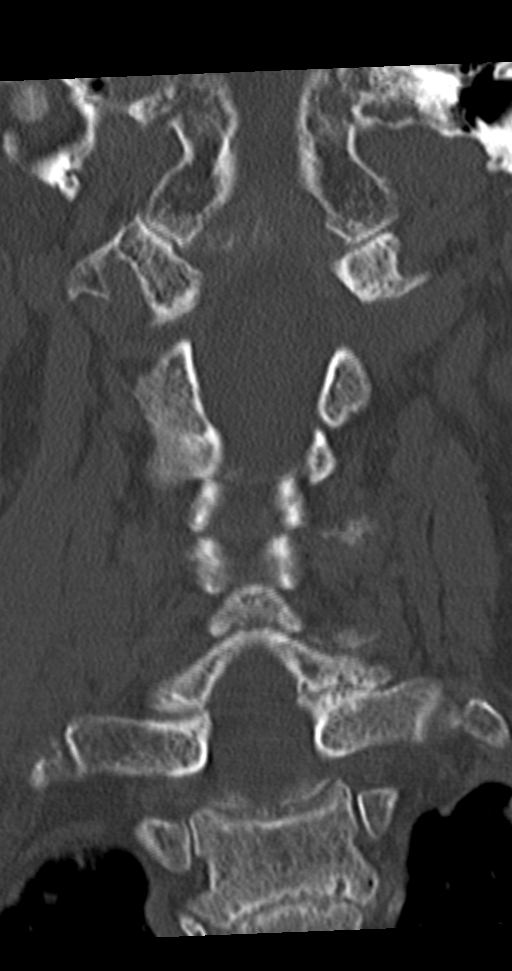

[12 of 33 positions shown; findings below may reference images not displayed]

FINDINGS: Bone windows demonstrate no significant soft tissue
swelling.  Mild motion artifact. Clear paranasal sinuses and
mastoid air cells.

Soft tissue windows demonstrate expected cerebral atrophy.
Moderate low density in the periventricular white matter likely
related to small vessel disease.  Ventriculomegaly is secondary. No
mass lesion, hemorrhage, hydrocephalus, acute infarct, intra-axial,
or extra-axial fluid collection.
IMPRESSION: 1. No acute intracranial abnormality.
2. Cerebral atrophy and small vessel ischemic change.

CT CERVICAL SPINE WITHOUT CONTRAST
FINDINGS: Spinal visualization through the mid T2 level.
Prevertebral soft tissues are within normal limits.  No apical
pneumothorax.

Skull base intact.  A maintenance of vertebral body height and
alignment.  Severe C3-C6 spondylosis.  Facets are aligned.  Fusion
of the right-sided C2-C3 facets.  C1-C2 articulation without acute
finding on coronal reformatted imaging.
IMPRESSION: Severe spondylosis without acute finding.

## 2011-09-20 LAB — URINALYSIS, ROUTINE W REFLEX MICROSCOPIC
Bilirubin Urine: NEGATIVE
Glucose, UA: NEGATIVE
Hgb urine dipstick: NEGATIVE
Ketones, ur: NEGATIVE
Nitrite: NEGATIVE
Protein, ur: NEGATIVE
Specific Gravity, Urine: 1.015
Urobilinogen, UA: 0.2
pH: 6.5

## 2011-09-20 LAB — BASIC METABOLIC PANEL
CO2: 28
Chloride: 104
Creatinine, Ser: 0.99
GFR calc Af Amer: >60

## 2011-09-22 LAB — COMPREHENSIVE METABOLIC PANEL
CO2: 31
Calcium: 9.1
Creatinine, Ser: 1.06
GFR calc Af Amer: 60 — ABNORMAL LOW
GFR calc non Af Amer: 50 — ABNORMAL LOW
Glucose, Bld: 143 — ABNORMAL HIGH

## 2011-09-22 LAB — CBC
Hemoglobin: 12.6
MCHC: 35
MCV: 95.1
RBC: 3.77 — ABNORMAL LOW

## 2011-09-22 LAB — DIFFERENTIAL
Lymphocytes Relative: 23
Lymphs Abs: 1.5
Neutro Abs: 4.3
Neutrophils Relative %: 68

## 2011-09-22 LAB — VITAMIN B12: Vitamin B-12: 1210 — ABNORMAL HIGH (ref 211–911)

## 2011-09-22 LAB — PHENYTOIN LEVEL, TOTAL: Phenytoin Lvl: 10.5

## 2011-09-22 LAB — RPR: RPR Ser Ql: NONREACTIVE

## 2016-07-12 NOTE — Progress Notes (Signed)
ERROR
